# Patient Record
Sex: Male | Born: 1975 | Race: White | Hispanic: No | Smoking: Former smoker
Health system: Southern US, Community
[De-identification: ages and names within clinical notes are randomized; demographics above are authoritative.]

## PROBLEM LIST (undated history)

## (undated) DIAGNOSIS — R45851 Suicidal ideations: Secondary | ICD-10-CM

## (undated) DIAGNOSIS — K219 Gastro-esophageal reflux disease without esophagitis: Secondary | ICD-10-CM

## (undated) DIAGNOSIS — F329 Major depressive disorder, single episode, unspecified: Secondary | ICD-10-CM

## (undated) DIAGNOSIS — F1010 Alcohol abuse, uncomplicated: Secondary | ICD-10-CM

## (undated) DIAGNOSIS — Z8719 Personal history of other diseases of the digestive system: Secondary | ICD-10-CM

## (undated) DIAGNOSIS — F32A Depression, unspecified: Secondary | ICD-10-CM

## (undated) HISTORY — DX: Gastro-esophageal reflux disease without esophagitis: K21.9

---

## 2006-04-04 ENCOUNTER — Emergency Department (HOSPITAL_COMMUNITY): Admission: EM | Admit: 2006-04-04 | Discharge: 2006-04-04 | Payer: Self-pay | Admitting: Emergency Medicine

## 2008-05-18 ENCOUNTER — Encounter: Payer: Self-pay | Admitting: Cardiovascular Disease

## 2008-05-18 HISTORY — PX: TRANSTHORACIC ECHOCARDIOGRAM: SHX275

## 2008-05-31 ENCOUNTER — Ambulatory Visit: Payer: Self-pay | Admitting: Internal Medicine

## 2008-05-31 DIAGNOSIS — R197 Diarrhea, unspecified: Secondary | ICD-10-CM

## 2008-05-31 DIAGNOSIS — K219 Gastro-esophageal reflux disease without esophagitis: Secondary | ICD-10-CM

## 2008-05-31 DIAGNOSIS — R079 Chest pain, unspecified: Secondary | ICD-10-CM

## 2008-09-12 ENCOUNTER — Encounter: Payer: Self-pay | Admitting: Internal Medicine

## 2009-10-02 ENCOUNTER — Encounter: Payer: Self-pay | Admitting: Internal Medicine

## 2009-10-12 ENCOUNTER — Emergency Department (HOSPITAL_COMMUNITY): Admission: EM | Admit: 2009-10-12 | Discharge: 2009-10-12 | Payer: Self-pay | Admitting: Emergency Medicine

## 2009-10-14 ENCOUNTER — Telehealth: Payer: Self-pay | Admitting: Internal Medicine

## 2009-10-21 DIAGNOSIS — Z8719 Personal history of other diseases of the digestive system: Secondary | ICD-10-CM

## 2009-10-21 DIAGNOSIS — E785 Hyperlipidemia, unspecified: Secondary | ICD-10-CM

## 2009-10-22 ENCOUNTER — Ambulatory Visit: Payer: Self-pay | Admitting: Gastroenterology

## 2009-10-22 ENCOUNTER — Encounter: Payer: Self-pay | Admitting: Physician Assistant

## 2009-10-22 DIAGNOSIS — R1013 Epigastric pain: Secondary | ICD-10-CM | POA: Insufficient documentation

## 2009-10-22 DIAGNOSIS — R195 Other fecal abnormalities: Secondary | ICD-10-CM

## 2009-10-26 ENCOUNTER — Emergency Department (HOSPITAL_COMMUNITY): Admission: EM | Admit: 2009-10-26 | Discharge: 2009-10-26 | Payer: Self-pay | Admitting: Emergency Medicine

## 2009-10-29 ENCOUNTER — Ambulatory Visit: Payer: Self-pay | Admitting: Internal Medicine

## 2009-10-31 ENCOUNTER — Emergency Department (HOSPITAL_COMMUNITY): Admission: EM | Admit: 2009-10-31 | Discharge: 2009-11-01 | Payer: Self-pay | Admitting: Emergency Medicine

## 2010-03-11 ENCOUNTER — Emergency Department (HOSPITAL_COMMUNITY): Admission: EM | Admit: 2010-03-11 | Discharge: 2010-03-11 | Payer: Self-pay | Admitting: Emergency Medicine

## 2010-05-27 NOTE — Letter (Signed)
Summary: [facility]  [facility]   Imported By: Sherian Rein 10/21/2009 12:09:07  _____________________________________________________________________  External Attachment:    Type:   Image     Comment:   External Document

## 2010-05-27 NOTE — Letter (Signed)
Summary: [facility]  [facility]   Imported By: Sherian Rein 10/21/2009 12:08:08  _____________________________________________________________________  External Attachment:    Type:   Image     Comment:   External Document

## 2010-05-27 NOTE — Progress Notes (Signed)
Summary: Appt sooner than 11-01-09  Phone Note Call from Patient Call back at Home Phone [phone]   Call For: DR Juanluis Guastella Reason for Call: Talk to Nurse Summary of Call: His stomach was bleeding- went to Emergency Room. Wonders if he can be seen sooner than 11-01-09 Initial call taken by: Leanor Kail [facility],  October 14, 2009 11:34 AM  Follow-up for Phone Call        Given appt. with PA for Tuesday. Follow-up by: Teryl Lucy RN,  October 14, 2009 3:08 PM

## 2010-05-27 NOTE — Letter (Signed)
Summary: EGD Instructions  [facility]  [address] [place], [place] [address]   Phone: [phone]  Fax: [phone]       Scott Chandler    1975-12-11    MRN: [id number]       Procedure Day /Date: 10/29/09 Tuesday     Arrival Time: 2:00 pm     Procedure Time: 3:00 pm     Location of Procedure:                    _ x _ [facility] (4th Floor)  PREPARATION FOR ENDOSCOPY   On 10/29/09 THE DAY OF THE PROCEDURE:  1.   No solid foods, milk or milk products are allowed after midnight the night before your procedure.  2.   Do not drink anything colored red or purple.  Avoid juices with pulp.  No orange juice.  3.  You may drink clear liquids until 1:00 pm, which is 2 hours before your procedure.                                                                                                CLEAR LIQUIDS INCLUDE: Water Jello Ice Popsicles Tea (sugar ok, no milk/cream) Powdered fruit flavored drinks Coffee (sugar ok, no milk/cream) Gatorade Juice: apple, white grape, white cranberry  Lemonade Clear bullion, consomm, broth Carbonated beverages (any kind) Strained chicken noodle soup Hard Candy   MEDICATION INSTRUCTIONS  Unless otherwise instructed, you should take regular prescription medications with a small sip of water as early as possible the morning of your procedure.                  OTHER INSTRUCTIONS  You will need a responsible adult at least 37 years of age to accompany you and drive you home.   This person must remain in the waiting room during your procedure.  Wear loose fitting clothing that is easily removed.  Leave jewelry and other valuables at home.  However, you may wish to bring a book to read or an iPod/MP3 player to listen to music as you wait for your procedure to start.  Remove all body piercing jewelry and leave at home.  Total time from sign-in until discharge is approximately 2-3 hours.  You should go home  directly after your procedure and rest.  You can resume normal activities the day after your procedure.  The day of your procedure you should not:   Drive   Make legal decisions   Operate machinery   Drink alcohol   Return to work  You will receive specific instructions about eating, activities and medications before you leave.    The above instructions have been reviewed and explained to me by   Lamona Curl CMA Duncan Dull)  October 22, 2009 9:05 AM     I fully understand and can verbalize these instructions _____________________________ Date 10/22/09

## 2010-05-27 NOTE — Assessment & Plan Note (Signed)
Summary: EPIGASTRIC PAIN, HX GERD...AS./post er   History of Present Illness Visit Type: consult Primary GI MD: Yancey Flemings MD Primary Provider: Maryelizabeth Rowan, MD Requesting Provider: Maryelizabeth Rowan, MD Chief Complaint: Epigastric pain, vomiting blood History of Present Illness:   34 YO MALE KNOWN TO DR. PERRY FROM EVALUATION IN 2010. HE HAS HX OF GERD AND GASTRITIS. HE HAD REMOTE EGD IN [place] ABOUT 10 YEARS AGO SHOWING GASTRITIS.HE HAS BEEN ON OMEPRAZOLE 20 MG DAILY OVER THE PAST 18 MONTHS. HE HAD A RECENT ER VISIT WITH ACUTE EPIGASTRIC PAIN A DARK STOOL AND HAD VOMITED WITH A METALLIC BLOOD TINGED TASTE . LABS WERE UNREMARKABLE, INCLUDING LIVER TESTS AND LIPASE. HGB 16.6.  HE HAD BEEN TAKING 2 IBUPROFEN DAILY WHICH HE STOPPED. HE ALSO DRINKS REGULARLY THOUGH DENIES DAILY USE TO ME. HE SAYS HE MAY DRINK ALOT ON WEEKENDS THEN MAYBE ONCE DURING THE WEEK-NONE SINCE ER VISIT.DENIES HEARTBURN,DYSPHAGIA. APPETITIE HAD BEEN OFF, WEIGHT DOWN A COUPLE POUNDS, NAUSEA INTERMITTENT. NO FURTHER VOMITING, OR DARK STOOLS. HE HAS HAD INTERMITTENT SHARP CHEST PAINS FOR A COUPLE YEARS AND SOME PAIN UNDER LEFT RIBS. PREVIOUS NEGATIVE CARDIAC W/U.   HE HAS BACK PAIN X 3 DAYS SINCE LIFTED HIS WIFE'S WHEELCHAIR.   GI Review of Systems    Reports abdominal pain, acid reflux, chest pain, loss of appetite, nausea, vomiting, and  vomiting blood.     Location of  Abdominal pain: epigastric area.    Denies belching, bloating, dysphagia with liquids, dysphagia with solids, heartburn, and  weight loss.        Denies anal fissure, black tarry stools, change in bowel habit, constipation, diarrhea, diverticulosis, fecal incontinence, heme positive stool, hemorrhoids, irritable bowel syndrome, jaundice, light color stool, liver problems, rectal bleeding, and  rectal pain.   Current Medications (verified): 1)  Omeprazole 20 Mg Cpdr (Omeprazole) .[name].. 1 Tablet By Mouth Once Daily 2)  Claritin 10 Mg Tabs (Loratadine)  .... Take 1 Tablet By Mouth Once A Day As Needed  Allergies (verified): 1)  * Narcotics  Past History:  Past Medical History: Reviewed history from 05/31/2008 and no changes required. Anxiety Disorder Depression GERD Hyperlipidemia Obesity  Past Surgical History: Reviewed history from 05/31/2008 and no changes required. Unremarkable  Family History: Family History of Breast Cancer:mother and grandmother Family History of Diabetes: father Family History of Heart Disease: father No FH of Colon Cancer: Family History of Prostate Cancer: MGF  Social History: Married, 1 girl Occupation: Consulting civil engineer Patient is a former smoker. -stopped in January 2007 Alcohol Use - yes   1/2-1 gallon of whiskey per week; 6-8 drinks daily of scotch Daily Caffeine Use soft drinks Illicit Drug Use - no  Review of Systems  The patient denies allergy/sinus, anemia, anxiety-new, arthritis/joint pain, back pain, blood in urine, breast changes/lumps, confusion, cough, coughing up blood, depression-new, fainting, fatigue, fever, headaches-new, hearing problems, heart murmur, heart rhythm changes, itching, muscle pains/cramps, night sweats, nosebleeds, shortness of breath, skin rash, sleeping problems, sore throat, swelling of feet/legs, swollen lymph glands, thirst - excessive, urination - excessive, urination changes/pain, urine leakage, vision changes, and voice change.         SEE HPI  Vital Signs:  Patient profile:   35 year old male Height:      71 inches Weight:      254 pounds BMI:     35.55 Pulse rate:   76 / minute Pulse rhythm:   regular BP sitting:   112 / 80  (left arm) Cuff size:  large  Vitals Entered By: Francee Piccolo CMA Duncan Dull) (October 22, 2009 8:38 AM)  Physical Exam  General:  Well developed, well nourished, no acute distress. Head:  Normocephalic and atraumatic. Eyes:  PERRLA, no icterus. Lungs:  Clear throughout to auscultation. Heart:  Regular rate and rhythm; no  murmurs, rubs,  or bruits. Abdomen:  SOFT, TENDER EPIGASTRIUM,ALSO TENDER OVER XYPHOID PROCESS. NO MASS OR HSM,BS+ Rectal:  NOT DONE Extremities:  No clubbing, cyanosis, edema or deformities noted. Neurologic:  Alert and  oriented x4;  grossly normal neurologically. Psych:  Alert and cooperative. Normal mood and affect.  Impression & Recommendations:  Problem # 1:  EPIGASTRIC PAIN (ICD-789.06) Assessment Deteriorated 34 YO MALE WITH HX OF GERD,ON CHRONIC PPI-WITH RECENT ACUTE EPIGASTRIC PAIN,HEME POSITIVE STOOL. SUSPECT NSAID AND ETOH INDUCD GASTROPATHY/R/O PUD. INCREASE OMEPRAZOLE TO 20 MG TWICE DAILY NO NSAIDS DISCUSSED ETOH USE, TOLD THAT HE IS CONSUMING TOO MUCH ,TOLD TO SIGNIFICANTLY DECREASE INTAKE. ULTRAM 50 MG  Q8 HOURS AS NEEDED FOR ACUTE BACK PAIN-LIMITED RX WILL NOT REFILL. EGD WITH DR. PERRY. PROCEDURE DISCUSSED  IN DETAIL WITH PT, RISKS, BENEFITS AND ALTERNATIVES AND HE IS AGREEABLE. Orders: EGD (EGD)  Problem # 2:  CHEST PAIN (ICD-786.50) Assessment: Unchanged CHRONIC-INTERMITTENT MAY HAVE COMPONENT SECONDARY TO GERD,BUT ALSO DEFINTE MUSCULOSKELETAL  COMPONENT  AS WELL.  Patient Instructions: 1)  You have been scheduled for an Endoscopy with Dr Marina Goodell on 10/29/09. 2)  Please increase your omeprazole to two times a day dosing. 3)  Please avoid all Aspirin and anti-inflamatory medications. 4)  We have sent you a prescription for ultram for you to take every 8 hours on an as needed basis. 5)  Copy sent to : Dr Maryelizabeth Rowan, Dr Yancey Flemings 6)  The medication list was reviewed and reconciled.  All changed / newly prescribed medications were explained.  A complete medication list was provided to the patient / caregiver.  Prescriptions: ULTRAM 50 MG TABS (TRAMADOL HCL) Take 1 tablet by mouth every 8 hours as needed  #30 x 0   Entered by:   Lamona Curl CMA (AAMA)   Authorized by:   Sammuel Cooper PA-c   Signed by:   Lamona Curl CMA (AAMA) on 10/22/2009    Method used:   Electronically to        [facility] [phone]* (retail)       [address]       [place], [place]  [address]       Ph: [phone]       Fax: [phone]   RxID:   [id number]

## 2010-05-27 NOTE — Procedures (Signed)
Summary: Upper Endoscopy  Patient: Scott Chandler Note: All result statuses are Final unless otherwise noted.  Tests: (1) Upper Endoscopy (EGD)   EGD Upper Endoscopy       DONE     [facility]     [address]     [place], [place]  [address]           ENDOSCOPY PROCEDURE REPORT           PATIENT:  [name], [name]  MR#:  [id number]     BIRTHDATE:  [birth year], 34 yrs. old  GENDER:  male           ENDOSCOPIST:  Wilhemina Bonito. Eda Keys, MD     Referred by:  Office           PROCEDURE DATE:  10/29/2009     PROCEDURE:  EGD, diagnostic     ASA CLASS:  Class II     INDICATIONS:  hematemesis, epigastric pain, chest pain           MEDICATIONS:   Benadryl 50 mg IV, Versed 10 mg IV     TOPICAL ANESTHETIC:  Exactacain Spray           DESCRIPTION OF PROCEDURE:   After the risks benefits and     alternatives of the procedure were thoroughly explained, informed     consent was obtained.  The LB [id number]-H180 [id number] endoscope was     introduced through the mouth and advanced to the second portion of     the duodenum, without limitations.  The instrument was slowly     withdrawn as the mucosa was fully examined.     <<PROCEDUREIMAGES>>           The esophagus and gastroesophageal junction were completely normal     in appearance.  The stomach was entered and closely examined. The     antrum, angularis, and lesser curvature were well visualized,     including a retroflexed view of the cardia and fundus. The stomach     wall was normally distensable. The scope passed easily through the     pylorus into the duodenum.  The duodenal bulb was normal in     appearance, as was the postbulbar duodenum.    Retroflexed views     revealed a hiatal hernia.    The scope was then withdrawn from the     patient and the procedure completed.           COMPLICATIONS:  None           ENDOSCOPIC IMPRESSION:     1) Normal esophagus     2) Normal stomach     3) Normal duodenum     4) A hiatal hernia     5)  Gerd     RECOMMENDATIONS:     1) Anti-reflux regimen to be followed     2) continue Omeprazole daily     3) MINIMIZE NSAIDS AND ALCOHOL USE           ______________________________     Wilhemina Bonito. Eda Keys, MD           CC:  Maryelizabeth Rowan, MD, The Patient           n.     eSIGNED:   Wilhemina Bonito. Eda Keys at 10/29/2009 02:55 PM           Hiram Comber, [id number]  Note: An exclamation mark (!) indicates a result that  was not dispersed into the flowsheet. Document Creation Date: 10/29/2009 2:56 PM _______________________________________________________________________  (1) Order result status: Final Collection or observation date-time: 10/29/2009 14:49 Requested date-time:  Receipt date-time:  Reported date-time:  Referring Physician:   Ordering Physician: Fransico Setters [id number]) Specimen Source:  Source: Launa Grill Order Number: [id number] Lab site:

## 2010-07-08 LAB — DIFFERENTIAL
Basophils Relative: 1 % (ref 0–1)
Lymphocytes Relative: 33 % (ref 12–46)
Monocytes Absolute: 0.9 10*3/uL (ref 0.1–1.0)
Monocytes Relative: 11 % (ref 3–12)
Neutro Abs: 4.6 10*3/uL (ref 1.7–7.7)

## 2010-07-08 LAB — COMPREHENSIVE METABOLIC PANEL
AST: 24 U/L (ref 0–37)
BUN: 11 mg/dL (ref 6–23)
CO2: 28 mEq/L (ref 19–32)
Calcium: 9.4 mg/dL (ref 8.4–10.5)
Chloride: 103 mEq/L (ref 96–112)
Creatinine, Ser: 1.06 mg/dL (ref 0.4–1.5)
GFR calc Af Amer: >60 mL/min (ref >60)

## 2010-07-08 LAB — CBC
HCT: 43 % (ref 39.0–52.0)
MCH: 30.7 pg (ref 26.0–34.0)
MCV: 87.4 fL (ref 78.0–100.0)
RDW: 12.7 % (ref 11.5–15.5)
WBC: 8.5 10*3/uL (ref 4.0–10.5)

## 2010-07-08 LAB — POCT CARDIAC MARKERS: Troponin i, poc: <0.05 ng/mL (ref 0.00–0.09)

## 2010-07-13 LAB — COMPREHENSIVE METABOLIC PANEL
ALT: 53 U/L (ref 0–53)
BUN: 18 mg/dL (ref 6–23)
Creatinine, Ser: 0.97 mg/dL (ref 0.4–1.5)
Total Bilirubin: 0.5 mg/dL (ref 0.3–1.2)
Total Protein: 7.7 g/dL (ref 6.0–8.3)

## 2010-07-13 LAB — CBC
HCT: 47.4 % (ref 39.0–52.0)
Hemoglobin: 16.6 g/dL (ref 13.0–17.0)
MCHC: 34.9 g/dL (ref 30.0–36.0)
MCV: 88.9 fL (ref 78.0–100.0)
RDW: 13 % (ref 11.5–15.5)

## 2010-07-13 LAB — URINALYSIS, ROUTINE W REFLEX MICROSCOPIC
Bilirubin Urine: NEGATIVE
Glucose, UA: NEGATIVE mg/dL
Ketones, ur: 15 mg/dL — AB
Specific Gravity, Urine: 1.013 (ref 1.005–1.030)
pH: 5.5 (ref 5.0–8.0)

## 2010-10-26 ENCOUNTER — Emergency Department (HOSPITAL_COMMUNITY)
Admission: EM | Admit: 2010-10-26 | Discharge: 2010-10-26 | Disposition: A | Discharge status: Home / Self Care | Payer: PRIVATE HEALTH INSURANCE | Attending: Emergency Medicine | Admitting: Emergency Medicine

## 2010-10-26 DIAGNOSIS — S335XXA Sprain of ligaments of lumbar spine, initial encounter: Secondary | ICD-10-CM | POA: Insufficient documentation

## 2010-10-26 DIAGNOSIS — M545 Low back pain, unspecified: Secondary | ICD-10-CM | POA: Insufficient documentation

## 2010-10-26 DIAGNOSIS — X58XXXA Exposure to other specified factors, initial encounter: Secondary | ICD-10-CM | POA: Insufficient documentation

## 2010-10-26 DIAGNOSIS — K219 Gastro-esophageal reflux disease without esophagitis: Secondary | ICD-10-CM | POA: Insufficient documentation

## 2011-08-19 ENCOUNTER — Encounter: Payer: Self-pay | Admitting: *Deleted

## 2011-08-29 IMAGING — CR DG NECK SOFT TISSUE
2 series · 2 of 2 positions shown · non-contrast
Comparison: None.

CLINICAL DATA: Inhaled piece of meat; stuck in throat.

NECK SOFT TISSUES - 1+ VIEW

[w soft tissue neck * (1 of 2)]
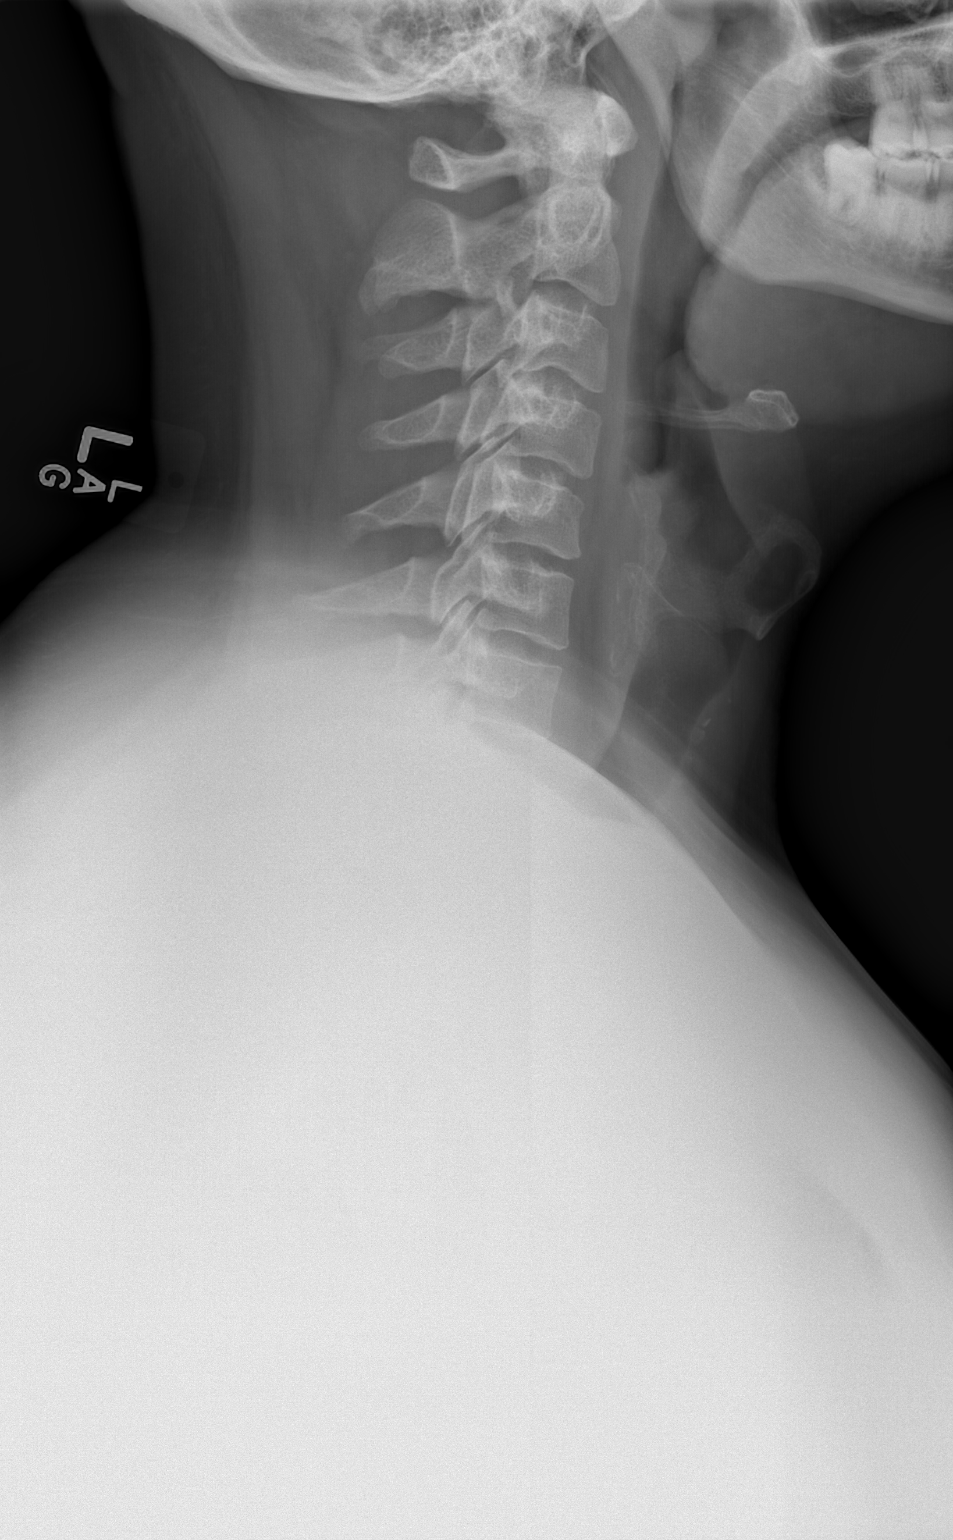

[w soft tissue neck * (2 of 2)]
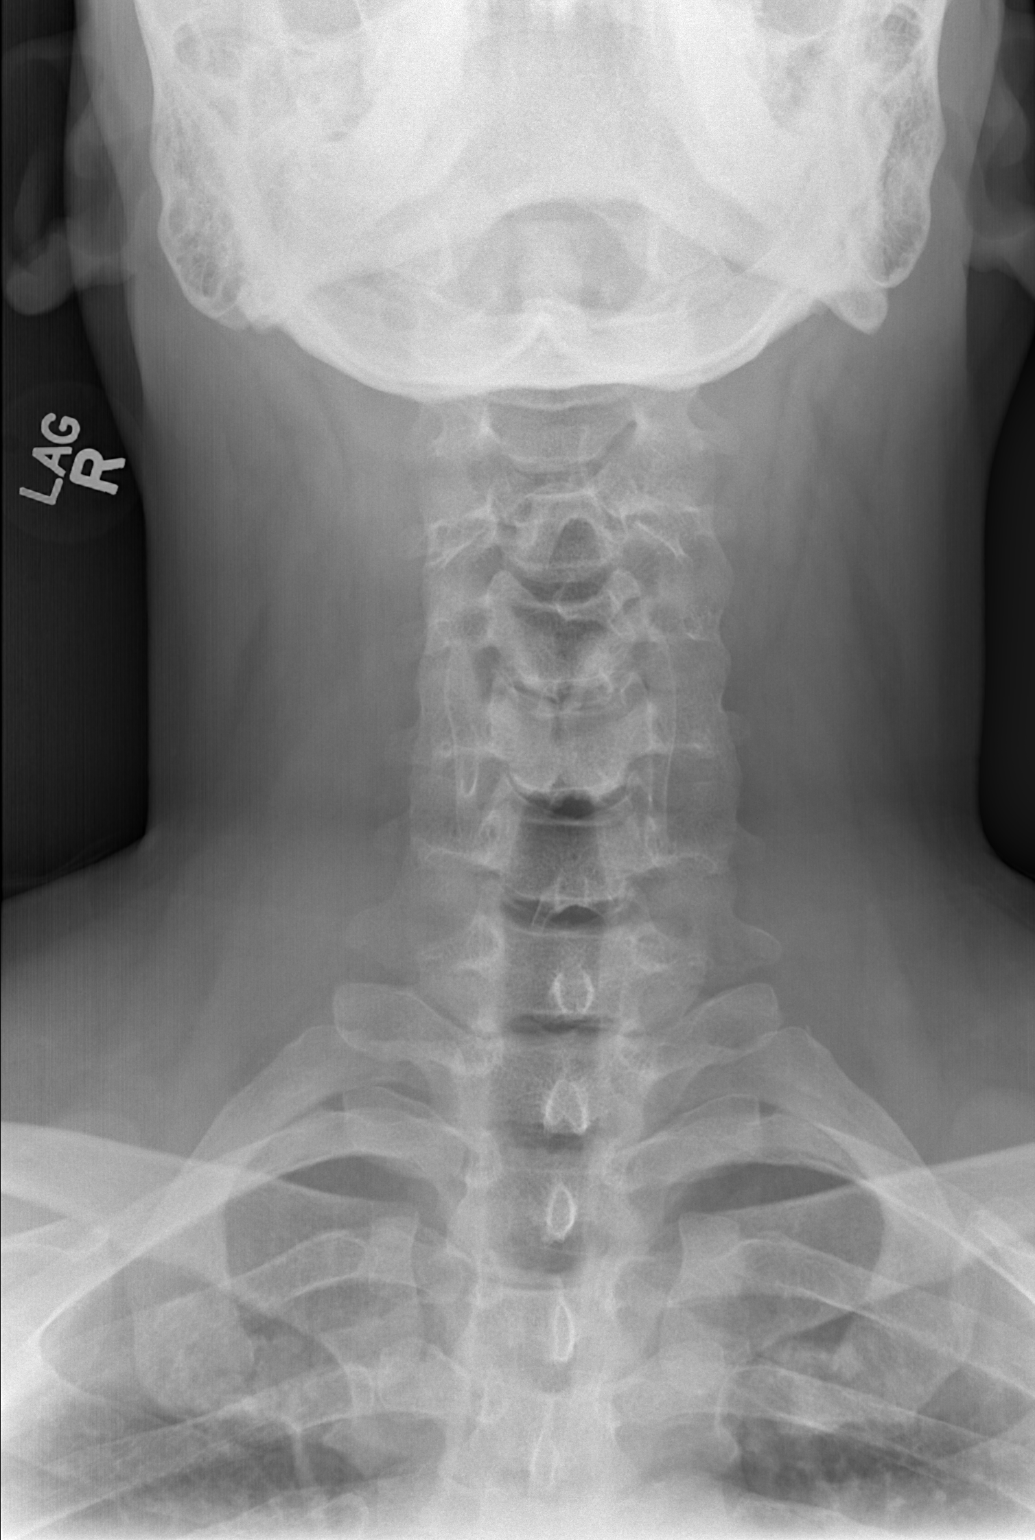

[2 of 2 positions shown; findings below may reference images not displayed]

FINDINGS: No definite foreign body is identified within the
hypopharynx or proximal trachea.  However, a small piece of meat
cannot be excluded, as this would not tend to be significantly
radiopaque.  The proximal trachea is grossly unremarkable in
appearance.

The proximal esophagus is decompressed and appears unremarkable.
The epiglottis is within normal limits.  Prevertebral soft tissues
are within normal limits.  No acute osseous abnormalities are seen.
The visualized lung apices are clear.
IMPRESSION: No definite radiopaque foreign body identified.  Hypopharynx,
proximal trachea and proximal esophagus unremarkable in appearance.

## 2015-03-05 ENCOUNTER — Encounter: Payer: Self-pay | Admitting: Internal Medicine

## 2015-11-14 ENCOUNTER — Emergency Department (HOSPITAL_COMMUNITY)
Admission: EM | Admit: 2015-11-14 | Discharge: 2015-11-14 | Disposition: A | Discharge status: Other Acute Inpatient Hospital | Payer: BLUE CROSS/BLUE SHIELD | Attending: Emergency Medicine | Admitting: Emergency Medicine

## 2015-11-14 ENCOUNTER — Encounter (HOSPITAL_COMMUNITY): Payer: Self-pay | Admitting: *Deleted

## 2015-11-14 ENCOUNTER — Encounter (HOSPITAL_COMMUNITY): Payer: Self-pay

## 2015-11-14 ENCOUNTER — Inpatient Hospital Stay (HOSPITAL_COMMUNITY)
Admission: AD | Admit: 2015-11-14 | Discharge: 2015-11-18 | DRG: 897 | Disposition: A | Discharge status: Home / Self Care | Payer: BLUE CROSS/BLUE SHIELD | Source: Intra-hospital | Attending: Psychiatry | Admitting: Psychiatry

## 2015-11-14 ENCOUNTER — Ambulatory Visit (HOSPITAL_COMMUNITY)
Admission: RE | Admit: 2015-11-14 | Discharge: 2015-11-14 | Disposition: A | Discharge status: Home / Self Care | Payer: BLUE CROSS/BLUE SHIELD | Source: Home / Self Care | Attending: Psychiatry | Admitting: Psychiatry

## 2015-11-14 DIAGNOSIS — Z87891 Personal history of nicotine dependence: Secondary | ICD-10-CM | POA: Diagnosis not present

## 2015-11-14 DIAGNOSIS — F10239 Alcohol dependence with withdrawal, unspecified: Secondary | ICD-10-CM | POA: Diagnosis present

## 2015-11-14 DIAGNOSIS — Z791 Long term (current) use of non-steroidal anti-inflammatories (NSAID): Secondary | ICD-10-CM | POA: Insufficient documentation

## 2015-11-14 DIAGNOSIS — G4700 Insomnia, unspecified: Secondary | ICD-10-CM | POA: Diagnosis present

## 2015-11-14 DIAGNOSIS — K219 Gastro-esophageal reflux disease without esophagitis: Secondary | ICD-10-CM | POA: Diagnosis present

## 2015-11-14 DIAGNOSIS — E785 Hyperlipidemia, unspecified: Secondary | ICD-10-CM | POA: Diagnosis present

## 2015-11-14 DIAGNOSIS — Z79899 Other long term (current) drug therapy: Secondary | ICD-10-CM | POA: Insufficient documentation

## 2015-11-14 DIAGNOSIS — K2990 Gastroduodenitis, unspecified, without bleeding: Secondary | ICD-10-CM | POA: Insufficient documentation

## 2015-11-14 DIAGNOSIS — Z818 Family history of other mental and behavioral disorders: Secondary | ICD-10-CM

## 2015-11-14 DIAGNOSIS — F10229 Alcohol dependence with intoxication, unspecified: Secondary | ICD-10-CM | POA: Diagnosis not present

## 2015-11-14 DIAGNOSIS — F1020 Alcohol dependence, uncomplicated: Secondary | ICD-10-CM | POA: Diagnosis present

## 2015-11-14 DIAGNOSIS — Z8249 Family history of ischemic heart disease and other diseases of the circulatory system: Secondary | ICD-10-CM

## 2015-11-14 DIAGNOSIS — F1094 Alcohol use, unspecified with alcohol-induced mood disorder: Secondary | ICD-10-CM | POA: Diagnosis not present

## 2015-11-14 DIAGNOSIS — F3289 Other specified depressive episodes: Secondary | ICD-10-CM | POA: Diagnosis not present

## 2015-11-14 DIAGNOSIS — F1024 Alcohol dependence with alcohol-induced mood disorder: Principal | ICD-10-CM | POA: Diagnosis present

## 2015-11-14 DIAGNOSIS — R45851 Suicidal ideations: Secondary | ICD-10-CM | POA: Diagnosis present

## 2015-11-14 DIAGNOSIS — F1014 Alcohol abuse with alcohol-induced mood disorder: Secondary | ICD-10-CM | POA: Diagnosis present

## 2015-11-14 DIAGNOSIS — F419 Anxiety disorder, unspecified: Secondary | ICD-10-CM | POA: Diagnosis present

## 2015-11-14 DIAGNOSIS — F329 Major depressive disorder, single episode, unspecified: Secondary | ICD-10-CM | POA: Diagnosis present

## 2015-11-14 DIAGNOSIS — K2920 Alcoholic gastritis without bleeding: Secondary | ICD-10-CM

## 2015-11-14 LAB — CBC WITH DIFFERENTIAL/PLATELET
BASOS ABS: 0.1 10*3/uL (ref 0.0–0.1)
BASOS PCT: 1 %
EOS PCT: 1 %
Eosinophils Absolute: 0.1 10*3/uL (ref 0.0–0.7)
HCT: 50.4 % (ref 39.0–52.0)
Hemoglobin: 17.7 g/dL — ABNORMAL HIGH (ref 13.0–17.0)
LYMPHS PCT: 28 %
Lymphs Abs: 2.5 10*3/uL (ref 0.7–4.0)
MCH: 30.9 pg (ref 26.0–34.0)
MCHC: 35.1 g/dL (ref 30.0–36.0)
MCV: 88.1 fL (ref 78.0–100.0)
MONO ABS: 1.1 10*3/uL — AB (ref 0.1–1.0)
Monocytes Relative: 13 %
Neutro Abs: 5.2 10*3/uL (ref 1.7–7.7)
Neutrophils Relative %: 57 %
PLATELETS: 251 10*3/uL (ref 150–400)
RBC: 5.72 MIL/uL (ref 4.22–5.81)
RDW: 13 % (ref 11.5–15.5)
WBC: 9 10*3/uL (ref 4.0–10.5)

## 2015-11-14 LAB — COMPREHENSIVE METABOLIC PANEL
ALK PHOS: 66 U/L (ref 38–126)
ALT: 38 U/L (ref 17–63)
ANION GAP: 12 (ref 5–15)
AST: 36 U/L (ref 15–41)
Albumin: 4.7 g/dL (ref 3.5–5.0)
BUN: 9 mg/dL (ref 6–20)
CALCIUM: 9.7 mg/dL (ref 8.9–10.3)
CHLORIDE: 99 mmol/L — AB (ref 101–111)
CO2: 26 mmol/L (ref 22–32)
Creatinine, Ser: 0.81 mg/dL (ref 0.61–1.24)
Glucose, Bld: 83 mg/dL (ref 65–99)
Potassium: 4.2 mmol/L (ref 3.5–5.1)
SODIUM: 137 mmol/L (ref 135–145)
Total Bilirubin: 0.7 mg/dL (ref 0.3–1.2)
Total Protein: 8.2 g/dL — ABNORMAL HIGH (ref 6.5–8.1)

## 2015-11-14 LAB — ETHANOL: ALCOHOL ETHYL (B): 21 mg/dL — AB (ref <5)

## 2015-11-14 LAB — SALICYLATE LEVEL

## 2015-11-14 LAB — RAPID URINE DRUG SCREEN, HOSP PERFORMED
AMPHETAMINES: NOT DETECTED
BENZODIAZEPINES: NOT DETECTED
Barbiturates: NOT DETECTED
Cocaine: NOT DETECTED
Opiates: NOT DETECTED
TETRAHYDROCANNABINOL: NOT DETECTED

## 2015-11-14 LAB — LIPASE, BLOOD: LIPASE: 45 U/L (ref 11–51)

## 2015-11-14 LAB — ACETAMINOPHEN LEVEL

## 2015-11-14 MED ORDER — HYDROXYZINE HCL 25 MG PO TABS
25.0000 mg | ORAL_TABLET | Freq: Four times a day (QID) | ORAL | Status: AC | PRN
  Administered 2015-11-15 – 2015-11-16 (×2): 25 mg via ORAL
  Filled 2015-11-14 (×2): qty 1

## 2015-11-14 MED ORDER — ONDANSETRON 4 MG PO TBDP: 4.0000 mg | ORAL_TABLET | Freq: Four times a day (QID) | ORAL | Status: DC | PRN

## 2015-11-14 MED ORDER — SERTRALINE HCL 50 MG PO TABS: 100.0000 mg | ORAL_TABLET | Freq: Every day | ORAL | Status: DC

## 2015-11-14 MED ORDER — LORAZEPAM 1 MG PO TABS: 1.0000 mg | ORAL_TABLET | Freq: Three times a day (TID) | ORAL | Status: DC

## 2015-11-14 MED ORDER — LORAZEPAM 1 MG PO TABS: 1.0000 mg | ORAL_TABLET | Freq: Every day | ORAL | Status: DC

## 2015-11-14 MED ORDER — PROPRANOLOL HCL 20 MG PO TABS
20.0000 mg | ORAL_TABLET | Freq: Two times a day (BID) | ORAL | Status: DC | PRN
  Filled 2015-11-14: qty 1

## 2015-11-14 MED ORDER — HYDROXYZINE HCL 25 MG PO TABS: 25.0000 mg | ORAL_TABLET | Freq: Four times a day (QID) | ORAL | Status: DC | PRN

## 2015-11-14 MED ORDER — LORAZEPAM 1 MG PO TABS
1.0000 mg | ORAL_TABLET | Freq: Every day | ORAL | Status: AC
  Administered 2015-11-17: 1 mg via ORAL
  Filled 2015-11-14: qty 1

## 2015-11-14 MED ORDER — MAGNESIUM HYDROXIDE 400 MG/5ML PO SUSP: 30.0000 mL | Freq: Every day | ORAL | Status: DC | PRN

## 2015-11-14 MED ORDER — PROPRANOLOL HCL 10 MG PO TABS
20.0000 mg | ORAL_TABLET | Freq: Two times a day (BID) | ORAL | Status: DC | PRN
  Administered 2015-11-17 – 2015-11-18 (×3): 20 mg via ORAL
  Filled 2015-11-14 (×3): qty 2

## 2015-11-14 MED ORDER — LORAZEPAM 1 MG PO TABS: 1.0000 mg | ORAL_TABLET | Freq: Four times a day (QID) | ORAL | Status: DC

## 2015-11-14 MED ORDER — ONDANSETRON 4 MG PO TBDP: 4.0000 mg | ORAL_TABLET | Freq: Four times a day (QID) | ORAL | Status: AC | PRN

## 2015-11-14 MED ORDER — LORAZEPAM 1 MG PO TABS
1.0000 mg | ORAL_TABLET | Freq: Three times a day (TID) | ORAL | Status: AC
  Administered 2015-11-15 (×3): 1 mg via ORAL
  Filled 2015-11-14 (×3): qty 1

## 2015-11-14 MED ORDER — ACETAMINOPHEN 325 MG PO TABS
650.0000 mg | ORAL_TABLET | Freq: Four times a day (QID) | ORAL | Status: DC | PRN
  Administered 2015-11-15 – 2015-11-18 (×5): 650 mg via ORAL
  Filled 2015-11-14 (×5): qty 2

## 2015-11-14 MED ORDER — LOPERAMIDE HCL 2 MG PO CAPS
2.0000 mg | ORAL_CAPSULE | ORAL | Status: AC | PRN
  Administered 2015-11-15: 4 mg via ORAL
  Filled 2015-11-14: qty 2

## 2015-11-14 MED ORDER — ALUM & MAG HYDROXIDE-SIMETH 200-200-20 MG/5ML PO SUSP: 30.0000 mL | ORAL | Status: DC | PRN

## 2015-11-14 MED ORDER — VITAMIN B-1 100 MG PO TABS
100.0000 mg | ORAL_TABLET | Freq: Every day | ORAL | Status: DC
  Administered 2015-11-15 – 2015-11-18 (×4): 100 mg via ORAL
  Filled 2015-11-14 (×6): qty 1

## 2015-11-14 MED ORDER — LORAZEPAM 1 MG PO TABS: 1.0000 mg | ORAL_TABLET | Freq: Two times a day (BID) | ORAL | Status: DC

## 2015-11-14 MED ORDER — THIAMINE HCL 100 MG/ML IJ SOLN: 100.0000 mg | Freq: Once | INTRAMUSCULAR | Status: DC

## 2015-11-14 MED ORDER — LORAZEPAM 1 MG PO TABS
1.0000 mg | ORAL_TABLET | Freq: Four times a day (QID) | ORAL | Status: AC
  Administered 2015-11-14: 1 mg via ORAL
  Filled 2015-11-14: qty 1

## 2015-11-14 MED ORDER — VITAMIN B-1 100 MG PO TABS: 100.0000 mg | ORAL_TABLET | Freq: Every day | ORAL | Status: DC

## 2015-11-14 MED ORDER — LORAZEPAM 1 MG PO TABS: 1.0000 mg | ORAL_TABLET | Freq: Four times a day (QID) | ORAL | Status: DC | PRN

## 2015-11-14 MED ORDER — LORAZEPAM 1 MG PO TABS
1.0000 mg | ORAL_TABLET | Freq: Two times a day (BID) | ORAL | Status: AC
  Administered 2015-11-16 (×2): 1 mg via ORAL
  Filled 2015-11-14 (×2): qty 1

## 2015-11-14 MED ORDER — LOPERAMIDE HCL 2 MG PO CAPS: 2.0000 mg | ORAL_CAPSULE | ORAL | Status: DC | PRN

## 2015-11-14 MED ORDER — SERTRALINE HCL 100 MG PO TABS
100.0000 mg | ORAL_TABLET | Freq: Every day | ORAL | Status: DC
  Administered 2015-11-15: 100 mg via ORAL
  Filled 2015-11-14 (×2): qty 1

## 2015-11-14 MED ORDER — ADULT MULTIVITAMIN W/MINERALS CH
1.0000 | ORAL_TABLET | Freq: Every day | ORAL | Status: DC
  Administered 2015-11-14 – 2015-11-18 (×5): 1 via ORAL
  Filled 2015-11-14 (×7): qty 1

## 2015-11-14 MED ORDER — LORAZEPAM 1 MG PO TABS
1.0000 mg | ORAL_TABLET | Freq: Four times a day (QID) | ORAL | Status: AC | PRN
  Administered 2015-11-15 – 2015-11-17 (×3): 1 mg via ORAL
  Filled 2015-11-14 (×3): qty 1

## 2015-11-14 MED ORDER — ADULT MULTIVITAMIN W/MINERALS CH: 1.0000 | ORAL_TABLET | Freq: Every day | ORAL | Status: DC

## 2015-11-14 NOTE — ED Notes (Signed)
[facility] once medically cleared.

## 2015-11-14 NOTE — Progress Notes (Signed)
Admission Note:  40 year old male who presents voluntary in no acute distress for the treatment of SI, Depression, and Substance Abuse.  Patient reports "I have been drinking for days and days and days".  Patient reports SI with a plan to "hang myself with a belt in the closet".  Patient states "I felt like I was about to snap mentally, got freaked out, and felt like I was going to lose my mind".  Patient appears flat and depressed. Patient was calm and cooperative with admission process. Patient presents with passive SI and contracts for safety upon admission. Patient denies AH. Patient reports "I vividly think I'm awake doing things and then realize that I am laying down with my eyes closed".  Patient reports stressors as recent separation from his wife. Patient reports that he has been separated from his wife for a month.  Patient reports that he called his wife when he began having suicidal thoughts.  Patient reports additional stressor of telling his wife that he has a girlfriend.  Patient's wife continues to be supportive of patient.  Patient also reports that his 1st wife and mother of his daughter died "2 summers ago" and he gets "down" during the summer.  Patient reports that he has had diarrhea for "months".  Skin was assessed and found to be clear of any abnormal marks.  Patient searched and no contraband found, POC and unit policies explained and understanding verbalized. Consents obtained. Food and fluids offered and accepted. Patient had no additional questions or concerns.

## 2015-11-14 NOTE — Progress Notes (Signed)
Pt. Seen for assessment, charge nurse [facility] notified of transport for medical clearance, pending Psych consult . Mariaclara Spear K. Sherlon HandingHarris, LCAS-A, LPC-A, [facility]  Counselor 11/14/2015 9:59 AM

## 2015-11-14 NOTE — ED Provider Notes (Signed)
CSN: [id number]     Arrival date & time 11/14/15  1040 History   First MD Initiated Contact with Patient 11/14/15 1203     Chief Complaint  Patient presents with  . Depression  . Detox   Pt is a 40 yo wm with a hx of alcohol abuse.  [facility].  He c/o depression and anxiety.  He denies si.  The pt has a bed once medically cleared.  He does have some mild epigastric pain.   (Consider location/radiation/quality/duration/timing/severity/associated sxs/prior Treatment) Patient is a 40 y.o. male presenting with depression. The history is provided by the patient. The history is limited by a language barrier.  Depression This is a recurrent problem. The current episode started more than 1 week ago. The problem occurs constantly. The symptoms are relieved by drinking.    Past Medical History  Diagnosis Date  . GERD (gastroesophageal reflux disease)   . Chest pain    Past Surgical History  Procedure Laterality Date  . Transthoracic echocardiogram  05/18/2008    normal LVF/ mild LVH/trace mitral and tricuspid regurgitation   Family History  Problem Relation Age of Onset  . Hypertension Father   . Cancer Mother    Social History  Substance Use Topics  . Smoking status: Former Games developermoker  . Smokeless tobacco: None  . Alcohol Use: Yes    Review of Systems  Psychiatric/Behavioral: Positive for depression and dysphoric mood. The patient is nervous/anxious.   All other systems reviewed and are negative.     Allergies  Review of patient's allergies indicates no known allergies.  Home Medications   Prior to Admission medications   Medication Sig Start Date End Date Taking? Authorizing Provider  ibuprofen (ADVIL,MOTRIN) 200 MG tablet Take 400-600 mg by mouth every 6 (six) hours as needed for fever, headache, mild pain, moderate pain or cramping.   Yes Historical Provider, MD  naltrexone (DEPADE) 50 MG tablet Take 50 mg by mouth daily.   Yes  Historical Provider, MD  Omega-3 Fatty Acids (FISH OIL) 1000 MG CAPS Take 2 capsules by mouth daily.   Yes Historical Provider, MD  propranolol (INDERAL) 20 MG tablet Take 20 mg by mouth 2 (two) times daily as needed (for anxiety).   Yes Historical Provider, MD  sertraline (ZOLOFT) 100 MG tablet Take 100 mg by mouth daily with breakfast.   Yes Historical Provider, MD   BP 136/97 mmHg  Pulse 87  Temp(Src) 97.8 F (36.6 C) (Oral)  Resp 18  SpO2 95% Physical Exam  Constitutional: He is oriented to person, place, and time. He appears well-developed and well-nourished.  HENT:  Head: Normocephalic and atraumatic.  Right Ear: External ear normal.  Left Ear: External ear normal.  Nose: Nose normal.  Mouth/Throat: Oropharynx is clear and moist.  Eyes: Conjunctivae and EOM are normal. Pupils are equal, round, and reactive to light.  Neck: Normal range of motion. Neck supple.  Cardiovascular: Normal rate, regular rhythm, normal heart sounds and intact distal pulses.   Pulmonary/Chest: Effort normal and breath sounds normal.  Abdominal: Soft. Bowel sounds are normal. There is tenderness in the epigastric area.  Musculoskeletal: Normal range of motion.  Neurological: He is alert and oriented to person, place, and time.  Skin: Skin is warm and dry.  Psychiatric: He has a normal mood and affect. His behavior is normal. Judgment and thought content normal.  Nursing note and vitals reviewed.   ED Course  Procedures (including  critical care time) Labs Review Labs Reviewed  ACETAMINOPHEN LEVEL - Abnormal; Notable for the following:    Acetaminophen (Tylenol), Serum <10 (*)    All other components within normal limits  COMPREHENSIVE METABOLIC PANEL - Abnormal; Notable for the following:    Chloride 99 (*)    Total Protein 8.2 (*)    All other components within normal limits  ETHANOL - Abnormal; Notable for the following:    Alcohol, Ethyl (B) 21 (*)    All other components within normal  limits  CBC WITH DIFFERENTIAL/PLATELET - Abnormal; Notable for the following:    Hemoglobin 17.7 (*)    Monocytes Absolute 1.1 (*)    All other components within normal limits  URINE RAPID DRUG SCREEN, HOSP PERFORMED  SALICYLATE LEVEL  LIPASE, BLOOD    Imaging Review No results found. I have personally reviewed and evaluated these images and lab results as part of my medical decision-making.   EKG Interpretation None      MDM  Labs are nl.  Pt ok for transfer to [facility].  Pt has a bed at [facility], so he will be transferred there.  Final diagnoses:  Alcohol dependence with withdrawal with complication (HCC)  Alcohol-induced mood disorder (HCC)      Jacalyn Lefevre, MD 11/14/15 1539

## 2015-11-14 NOTE — BH Assessment (Signed)
Tele Assessment Note   Scott ComberJason Chandler is an 40 y.o. male, Caucasian, Married who [facility] depression and SI. Patient states that his primary concern is of increasing depression with ETOH and binge drinking with current complaint of severe abdominal cramping and hx. Of lower back problems. Patient denies history of psychotic symptoms. Patient wife Scott Chandler present during assessment. Patient states that he last drank alcohol this morning with unknown amount. Per wife, patient has SI with psychological problems. Patient states that he has had recent decline in sleep with 2 hours per night in past month. Historically, pt. States that he has a daughter who is 6816 and she was sent to a group home, along with separation from wife in past month. Patient states that he has started isolating self and drinking heavily within past month. Patient currently stays by self.  Patient denies current [during assessment] SI, but acknowledges SI with plan to hang self in closet with belt. Patient denies history of SI or HI. Patient deneis history or current AVH, but acknowledges VH after drinking binge. Patient denies history of inpatient or outpatient psychiatric care, but states that he does [facility] and was prescribed Zoloft. Patient deneis any residential or outpatient S.A. Treatment. Patient acknowledges history of S.A. With alcohol starting at or around age 89 consuming unknown amounts with known average of 1/5 liquor/ vodka per day for years. Longest period of sobriety was x 1 day months ago from present date. Patient last drank on 11/14/15 this a.m. With unknown amounts.   Patient is dressed in normal street attire crying during assessment, and is alert and oriented x4. Patient speech was within normal limits and motor behavior appeared normal. Patient thought process is coherent but cloudy at times. Patient  does not appear to be responding to internal  stimuli. Patient was cooperative throughout the assessment and states that he  is agreeable to inpatient psychiatric treatment.   Diagnosis: Unspecified Depressive Disorder, Alcohol Use Disorder, Severe  Past Medical History:  Past Medical History  Diagnosis Date  . GERD (gastroesophageal reflux disease)   . Chest pain     Past Surgical History  Procedure Laterality Date  . Transthoracic echocardiogram  05/18/2008    normal LVF/ mild LVH/trace mitral and tricuspid regurgitation    Family History:  Family History  Problem Relation Age of Onset  . Hypertension Father   . Cancer Mother     Social History:  reports that he has quit smoking. He does not have any smokeless tobacco history on file. He reports that he drinks alcohol. His drug history is not on file.  Additional Social History:  Alcohol / Drug Use Pain Medications: SEE MAR Prescriptions: SEE MAR Over the Counter: SEE MAR History of alcohol / drug use?: Yes Longest period of sobriety (when/how long): x 1 day in the past month Negative Consequences of Use: Financial, Legal, Personal relationships Withdrawal Symptoms: Cramps, Patient aware of relationship between substance abuse and physical/medical complications Substance #1 Name of Substance 1: alcohol 1 - Age of First Use: 32 1 - Amount (size/oz): various with average of 1/5 liquor or more 1 - Frequency: daily 1 - Duration: years with binge x 1 month 1 - Last Use / Amount: 11/14/15 unknown amount  CIWA:   COWS:    PATIENT STRENGTHS: (choose at least two) Average or above average intelligence Capable of independent living Communication skills  Allergies: No Known Allergies  Home Medications:  (Not in a hospital admission)  OB/GYN Status:  No LMP for male patient.  General Assessment Data Location of Assessment: [facility] Assessment Services TTS Assessment: In system Is this a Tele or Face-to-Face Assessment?: Face-to-Face Is this an Initial Assessment or a  Re-assessment for this encounter?: Initial Assessment Marital status: Married [place] name: n/a Is patient pregnant?: No Pregnancy Status: No Living Arrangements: Alone Can pt return to current living arrangement?: Yes Admission Status: Voluntary Is patient capable of signing voluntary admission?: Yes Referral Source: Self/Family/Friend Insurance type: [employer]  Medical Screening Exam [facility] Walk-in ONLY) Medical Exam completed: No Reason for MSE not completed: Other: (pt to be transported upon consult for med. clearance)  Crisis Care Plan Living Arrangements: Alone Name of Psychiatrist: none Name of Therapist: none  Education Status Is patient currently in school?: No Current Grade: n/a Highest grade of school patient has completed: B.S. college Name of school: unspecified Contact person: [name] [phone]  Risk to self with the past 6 months Suicidal Ideation: Yes-Currently Present (denies current, but acknowledges present prior to arrival) Has patient been a risk to self within the past 6 months prior to admission? : Yes Suicidal Intent: Yes-Currently Present Has patient had any suicidal intent within the past 6 months prior to admission? : No Is patient at risk for suicide?: Yes Suicidal Plan?: Yes-Currently Present Has patient had any suicidal plan within the past 6 months prior to admission? : No Specify Current Suicidal Plan: hang self with belt in closet Access to Means: Yes Specify Access to Suicidal Means: access to belts What has been your use of drugs/alcohol within the last 12 months?: ETOH, Severe Previous Attempts/Gestures: No How many times?: 0 Other Self Harm Risks: none noted Triggers for Past Attempts: None known Intentional Self Injurious Behavior: None Family Suicide History: No Recent stressful life event(s): Trauma (Comment), Turmoil (Comment) (daughter in group home, seperation from wife) Persecutory voices/beliefs?: No Depression:  Yes Depression Symptoms: Despondent, Insomnia, Tearfulness, Isolating, Fatigue, Guilt, Loss of interest in usual pleasures, Feeling worthless/self pity Substance abuse history and/or treatment for substance abuse?: Yes Suicide prevention information given to non-admitted patients: Yes  Risk to Others within the past 6 months Homicidal Ideation: No Does patient have any lifetime risk of violence toward others beyond the six months prior to admission? : No Thoughts of Harm to Others: No Current Homicidal Intent: No Current Homicidal Plan: No Access to Homicidal Means: No Identified Victim: none History of harm to others?: No Assessment of Violence: None Noted Violent Behavior Description: none noted Does patient have access to weapons?: No Criminal Charges Pending?: No Does patient have a court date: No Is patient on probation?: No  Psychosis Hallucinations: Visual (pt states after alcohol binge VH is present) Delusions: None noted  Mental Status Report Appearance/Hygiene: Unremarkable Eye Contact: Poor Motor Activity: Unremarkable Speech: Logical/coherent Level of Consciousness: Alert, Crying Mood: Depressed, Anxious Affect: Anxious, Depressed Anxiety Level: Panic Attacks Panic attack frequency:  (unspecified) Most recent panic attack: 11/13/15 Thought Processes: Coherent, Relevant Judgement: Impaired Orientation: Person, Place, Time, Situation, Appropriate for developmental age Obsessive Compulsive Thoughts/Behaviors: None  Cognitive Functioning Concentration: Decreased Memory: Recent Intact, Remote Intact IQ: Average Insight: Poor Impulse Control: Poor Appetite: Fair Weight Loss: 0 Weight Gain: 0 Sleep: Decreased Total Hours of Sleep: 2 Vegetative Symptoms: None  ADLScreening [facility] Assessment Services) Patient's cognitive ability adequate to safely complete daily activities?: Yes Patient able to express need for assistance with ADLs?: Yes Independently performs  ADLs?:  Yes (appropriate for developmental age)  Prior Inpatient Therapy Prior Inpatient Therapy: No Prior Therapy Dates: n/a Prior Therapy Facilty/Provider(s): n/a Reason for Treatment: n/a  Prior Outpatient Therapy Prior Outpatient Therapy: No Prior Therapy Dates: n/a Prior Therapy Facilty/Provider(s): n/a Reason for Treatment: n/a Does patient have an ACCT team?: No Does patient have Intensive In-House Services?  : No Does patient have [facility] services? : No Does patient have P4CC services?: No  ADL Screening (condition at time of admission) Patient's cognitive ability adequate to safely complete daily activities?: Yes Is the patient deaf or have difficulty hearing?: No Does the patient have difficulty seeing, even when wearing glasses/contacts?: No Does the patient have difficulty concentrating, remembering, or making decisions?: No Patient able to express need for assistance with ADLs?: Yes Does the patient have difficulty dressing or bathing?: No Independently performs ADLs?: Yes (appropriate for developmental age) Does the patient have difficulty walking or climbing stairs?: No Weakness of Legs: None Weakness of Arms/Hands: None  Home Assistive Devices/Equipment Home Assistive Devices/Equipment: None    Abuse/Neglect Assessment (Assessment to be complete while patient is alone) Physical Abuse: Denies Verbal Abuse: Denies Sexual Abuse: Denies Exploitation of patient/patient's resources: Denies Self-Neglect: Denies Values / Beliefs Cultural Requests During Hospitalization: None Spiritual Requests During Hospitalization: None   Advance Directives (For Healthcare) Does patient have an advance directive?: No Would patient like information on creating an advanced directive?: No - patient declined information    Additional Information 1:1 In Past 12 Months?: No CIRT Risk: No Elopement Risk: No Does patient have medical clearance?: No     Disposition: Per  [place], DNP meets inpt criteria pending medical clearance. Disposition Initial Assessment Completed for this Encounter: Yes Disposition of Patient: Inpatient treatment program Type of inpatient treatment program: Adult  Hipolito Bayley 11/14/2015 9:30 AM

## 2015-11-14 NOTE — BH Assessment (Signed)
[facility] Assessment Progress Note  Per Thedore MinsMojeed Akintayo, MD, this pt requires psychiatric hospitalization at this time.  Lillia AbedLindsay, RN, [facility].  Pt has signed Voluntary Admission and Consent for Treatment, as well as Consent to Release Information to no one, and signed forms [facility].  Pt's nurse has been notified, and agrees to send original paperwork along with pt via [place], and to call report to [phone].  Doylene Canninghomas Brandt Chaney, MA Triage Specialist [phone]

## 2015-11-14 NOTE — ED Notes (Addendum)
Pt presents with c/o request for medical clearance. [facility] once medically cleared, reporting depression, anxiety, and request for detox from alcoholism. Denies any SI or HI at this time.

## 2015-11-14 NOTE — Tx Team (Signed)
Initial Interdisciplinary Treatment Plan   PATIENT STRESSORS: Financial difficulties Health problems Marital or family conflict Substance abuse   PATIENT STRENGTHS: Capable of independent living Manufacturing systems engineerCommunication skills Motivation for treatment/growth Supportive family/friends   PROBLEM LIST: Problem List/Patient Goals Date to be addressed Date deferred Reason deferred Estimated date of resolution  At risk for suicide 11/14/2015  11/14/2015   D/C  Substance Abuse 11/14/2015  11/14/2015   D/C  "Not drinking" 11/14/2015  11/14/2015   D/C  "Feeling better" 11/14/2015  11/14/2015   D/C                                 DISCHARGE CRITERIA:  Ability to meet basic life and health needs Adequate post-discharge living arrangements Improved stabilization in mood, thinking, and/or behavior Medical problems require only outpatient monitoring Motivation to continue treatment in a less acute level of care Need for constant or close observation no longer present Reduction of life-threatening or endangering symptoms to within safe limits Withdrawal symptoms are absent or subacute and managed without 24-hour nursing intervention  PRELIMINARY DISCHARGE PLAN: Attend 12-step recovery group Return to previous living arrangement Return to previous work or school arrangements  PATIENT/FAMIILY INVOLVEMENT: This treatment plan has been presented to and reviewed with the patient, Scott Chandler.  The patient and family have been given the opportunity to ask questions and make suggestions.  Larry SierrasMiddleton, Gregoria Selvy P 11/14/2015, 4:31 PM

## 2015-11-15 ENCOUNTER — Encounter (HOSPITAL_COMMUNITY): Payer: Self-pay | Admitting: Psychiatry

## 2015-11-15 DIAGNOSIS — F10239 Alcohol dependence with withdrawal, unspecified: Secondary | ICD-10-CM

## 2015-11-15 MED ORDER — TRAZODONE HCL 50 MG PO TABS
50.0000 mg | ORAL_TABLET | Freq: Every evening | ORAL | Status: DC | PRN
  Administered 2015-11-15 – 2015-11-17 (×3): 50 mg via ORAL
  Filled 2015-11-15 (×3): qty 1

## 2015-11-15 MED ORDER — DULOXETINE HCL 20 MG PO CPEP
40.0000 mg | ORAL_CAPSULE | Freq: Every day | ORAL | Status: DC
  Administered 2015-11-16 – 2015-11-18 (×3): 40 mg via ORAL
  Filled 2015-11-15 (×5): qty 2

## 2015-11-15 NOTE — Tx Team (Signed)
Interdisciplinary Treatment Plan Update (Adult)  Date:  11/15/2015  Time Reviewed:  8:34 AM   Progress in Treatment: Attending groups: No. New to unit. Continuing to assess.  Participating in groups:  No. Taking medication as prescribed:  Yes. Tolerating medication:  Yes. Family/Significant othe contact made:  SPE required for this pt.  Patient understands diagnosis:  Yes. and As evidenced by:  seeking treatment for ETOH abuse, depression, SI with plan, and for medication stabilization.  Discussing patient identified problems/goals with staff:  Yes. Medical problems stabilized or resolved:  Yes. Denies suicidal/homicidal ideation: Passive SI/able to contract for safety on the unit.  Issues/concerns per patient self-inventory:  Other:  Discharge Plan or Barriers: CSW assessing for appropriate referrals. Currently, pt sees his PCP at [facility] for medication management (zoloft). No mental health providers.   Reason for Continuation of Hospitalization: Depression Medication stabilization Suicidal ideation Withdrawal symptoms  Comments:  Scott Chandler is an 40 y.o. male, Caucasian, Married who presents to [facility] as walk in for depression and SI. Patient states that his primary concern is of increasing depression with ETOH and binge drinking with current complaint of severe abdominal cramping and hx. Of lower back problems. Patient denies history of psychotic symptoms. Patient wife Gwendolyn Nishi present during assessment. Patient states that he last drank alcohol this morning with unknown amount. Per wife, patient has SI with psychological problems. Patient states that he has had recent decline in sleep with 2 hours per night in past month. Historically, pt. States that he has a daughter who is 43 and she was sent to a group home, along with separation from wife in past month. Patient states that he has started isolating self and drinking heavily within past month. Patient  currently stays by self.Patient denies current [during assessment] SI, but acknowledges SI with plan to hang self in closet with belt. Patient denies history of SI or HI. Patient deneis history or current AVH, but acknowledges VH after drinking binge. Patient denies history of inpatient or outpatient psychiatric care, but states that he does see primary care provider at [facility] and was prescribed Zoloft.  With alcohol starting at or around age 48 consuming unknown amounts with known average of 1/5 liquor/ vodka per day for years. Longest period of sobriety was x 1 day months ago from present date.  Diagnosis: Unspecified Depressive Disorder, Alcohol Use Disorder, Severe  Estimated length of stay:  3-5 days   New goal(s): to develop effective aftercare plan.   Additional Comments:  Patient and CSW reviewed pt's identified goals and treatment plan. Patient verbalized understanding and agreed to treatment plan. CSW reviewed [facility] "Discharge Process and Patient Involvement" Form. Pt verbalized understanding of information provided and signed form.    Review of initial/current patient goals per problem list:  1. Goal(s): Patient will participate in aftercare plan  Met: No.   Target date: at discharge  As evidenced by: Patient will participate within aftercare plan AEB aftercare provider and housing plan at discharge being identified.  7/21: CSW assessing for appropriate referrals.   2. Goal (s): Patient will exhibit decreased depressive symptoms and suicidal ideations.  Met: No.    Target date: at discharge  As evidenced by: Patient will utilize self rating of depression at 3 or below and demonstrate decreased signs of depression or be deemed stable for discharge by MD.  7/21: Pt rates depression as high. Passive SI/Able to contract for safety on the unit. No HI/AVH reported by  pt.   3. Goal(s): Patient will demonstrate decreased signs of withdrawal due to  substance abuse  Met:No.   Target date:at discharge   As evidenced by: Patient will produce a CIWA/COWS score of 0, have stable vitals signs, and no symptoms of withdrawal.  7/21: Pt reports moderate withdrawals with no CIWA score taken; high standing BP. All other vitals are stable.   Attendees: Patient:   11/15/2015 8:34 AM   Family:   11/15/2015 8:34 AM   Physician:  Dr. Parke Poisson MD  11/15/2015 8:34 AM   Nursing:   Reita May RN 11/15/2015 8:34 AM   Clinical Social Worker: Maxie Better, LCSW 11/15/2015 8:34 AM   Clinical Social Worker:Lauren Madie Reno 11/15/2015 8:34 AM   Other:  Gerline Legacy Nurse Case Manager 11/15/2015 8:34 AM   Other:  Agustina Caroli NP; May Augustin NP  11/15/2015 8:34 AM   Other:   11/15/2015 8:34 AM   Other:  11/15/2015 8:34 AM   Other:  11/15/2015 8:34 AM   Other:  11/15/2015 8:34 AM    11/15/2015 8:34 AM    11/15/2015 8:34 AM    11/15/2015 8:34 AM    11/15/2015 8:34 AM    Scribe for Treatment Team:   Maxie Better, LCSW 11/15/2015 8:34 AM

## 2015-11-15 NOTE — Progress Notes (Signed)
Patient did not attend the evening speaker AA meeting. Pt was notified that group was beginning but reported " I don't need that, that is not for me, I just want my sleep medicine so I can go to sleep".  Pt was made aware that sleep medication is available after 2100.

## 2015-11-15 NOTE — [facility] Group Notes (Signed)
[facility] LCSW Group Therapy  11/15/2015 3:46 PM  Type of Therapy:  Group Therapy  Participation Level:  Minimal   Participation Quality:  Inattentive  Affect:  Appropriate  Cognitive:  Oriented  Insight:  Limited  Engagement in Therapy:  Limited  Modes of Intervention:  Confrontation, Discussion, Education, Exploration, Problem-solving, Rapport Building, Socialization and Support  Summary of Progress/Problems: Feelings around Relapse. Group members discussed the meaning of relapse and shared personal stories of relapse, how it affected them and others, and how they perceived themselves during this time. Group members were encouraged to identify triggers, warning signs and coping skills used when facing the possibility of relapse. Social supports were discussed and explored in detail. [name] was inattentive and disengaged during group but did stay in group for its entirety. Patient requested that CSW check to see if his phone was in his locker and stated that he wanted to discharge by Monday. [name] does not appear to be making progress in the group setting at this time and demonstrates limited insight.   Smart, Charlisha Market LCSW 11/15/2015, 3:46 PM

## 2015-11-15 NOTE — [facility] Suicide Risk Assessment (Signed)
[facility] INPATIENT:  Family/Significant Other Suicide Prevention Education  Suicide Prevention Education:  Patient Refusal for Family/Significant Other Suicide Prevention Education: The patient Scott Chandler has refused to provide written consent for family/significant other to be provided Family/Significant Other Suicide Prevention Education during admission and/or prior to discharge.  Physician notified.  SPE completed with pt, as pt refused to consent to family contact. SPI pamphlet provided to pt and pt was encouraged to share information with support network, ask questions, and talk about any concerns relating to SPE. Pt denies access to guns/firearms and verbalized understanding of information provided. Mobile Crisis information also provided to pt.   Smart, Atlee Kluth LCSW 11/15/2015, 10:58 AM

## 2015-11-15 NOTE — Progress Notes (Signed)
D    Pt interacts in the dayroom but did not want to go to group   He requested his medications at 9 p and asked for everything he could have    Pt lacks insight into his illness and laughs like it is a joke when talking about treatment and requests all the pill he can have of shots of etoh A   Verbal support given   Encouraged pt being vested in his treatment    Medications administered and effectiveness monitored   Q 15 min checks R   Pt is safe at present

## 2015-11-15 NOTE — Progress Notes (Addendum)
Data. Patient denies HI/VH. He does report SI, "On and off, all the time". He is able to give a verbal agreement to come to staff and let us know when/if he begins to feel like his SI will cause him to hurt himself. Does report, "lights that bend and move and become other things. But they aren't as bad as they have been in the past."   Patient interacting well with staff and other patients. When he is at the medication window he makes statements like, "Can you get me some vodka", and "What drugs can you get me?" Emphasis on the "drugs".  Reports pain through out day. Action. Emotional support and encouragement offered. Education provided on medication, indications and side effect. Q 15 minute checks done for safety. PRN pain meds given as scheduled/requested. Response. Safety on the unit maintained through 15 minute checks.  Medications taken as prescribed. Attended groups. Remained calm and appropriate through out shift. Decrease in pain with medication reported.

## 2015-11-15 NOTE — H&P (Signed)
Psychiatric Admission Assessment Adult  Patient Identification: Scott Chandler MRN:  [id number] Date of Evaluation:  11/15/2015 Chief Complaint: " I have been drinking a lot " Principal Diagnosis:  Alcohol Dependence,Alcohol Induced Mood Disorder, Depressed  Diagnosis:   Patient Active Problem List   Diagnosis Date Noted  . Alcohol dependence with withdrawal with complication ([place]) [Y30.160] 11/14/2015  . Alcohol-induced mood disorder ([place]) [F10.94] 11/14/2015  . EPIGASTRIC PAIN [R10.13] 10/22/2009  . FECAL OCCULT BLOOD [R19.5] 10/22/2009  . HYPERLIPIDEMIA [E78.5] 10/21/2009  . ESOPHAGITIS, HX OF [Z87.19] 10/21/2009  . GERD [K21.9] 05/31/2008  . CHEST PAIN [R07.9] 05/31/2008  . DIARRHEA [R19.7] 05/31/2008   History of Present Illness:40 year old man. History of alcohol dependence. He states that he has recently been drinking more over the last few weeks. Reports multiple stressors, including 42 year old daughter being in a group home, and using cocaine, wife having history of MS, states " I actually have to go to work in order to relax ". States he has been drinking more than a case of beer per day. States " I run out of the beer at night, and I wake up in withdrawals". States that in the context of these stressors and heavy alcohol use he has been feeling progressively more depressed, and was feeling suicidal , with thoughts of hanging himself . States he came to the hospital of his own accord, because " I knew I needed help"  Reports he last drank yesterday in AM. Admission BAL 21.  Associated Signs/Symptoms: Depression Symptoms:  depressed mood, anhedonia, insomnia, suicidal thoughts with specific plan, loss of energy/fatigue, decreased appetite, has lost about 10 lbs over last two weeks  (Hypo) Manic Symptoms:   Denies  Anxiety Symptoms:   States he worries excessively, " I feel like I worry about everything " Psychotic Symptoms:   Denies   PTSD Symptoms: denies   Total Time  spent with patient:  45 minutes   Past Psychiatric History: this is his first psychiatric admission, no history of suicide attempts, denies history of self cutting, or self injurious ideations, denies history of violence. Does not describe any clear history of mania or hypomania .   Is the patient at risk to self? Yes.    Has the patient been a risk to self in the past 6 months? Yes- Has the patient been a risk to self within the distant past? No.  Is the patient a risk to others? No.  Has the patient been a risk to others in the past 6 months? No.  Has the patient been a risk to others within the distant past? No.   Prior Inpatient Therapy:  denies  Prior Outpatient Therapy:  no   Alcohol Screening: 1. How often do you have a drink containing alcohol?: 4 or more times a week 2. How many drinks containing alcohol do you have on a typical day when you are drinking?: 10 or more 3. How often do you have six or more drinks on one occasion?: Daily or almost daily Preliminary Score: 8 4. How often during the last year have you found that you were not able to stop drinking once you had started?: Daily or almost daily 5. How often during the last year have you failed to do what was normally expected from you becasue of drinking?: Less than monthly 6. How often during the last year have you needed a first drink in the morning to get yourself going after a heavy drinking session?: Daily  or almost daily 7. How often during the last year have you had a feeling of guilt of remorse after drinking?: Daily or almost daily 8. How often during the last year have you been unable to remember what happened the night before because you had been drinking?: Less than monthly 9. Have you or someone else been injured as a result of your drinking?: No 10. Has a relative or friend or a doctor or another health worker been concerned about your drinking or suggested you cut down?: Yes, during the last year Alcohol Use  Disorder Identification Test Final Score (AUDIT): 30 Brief Intervention: Yes Substance Abuse History in the last 12 months: alcohol dependence, as above. States he has a remote history of abusing cocaine and cannabis, but states he has not used drugs in years . Consequences of Substance Abuse: No history of DUI, history of some blackouts, denies history of seizures, states he has had " bad withdrawals", and describes  transient visual disturbances, hallucinations when detoxing at times, but not full blown episode of DTs .  Longest period of sobriety 2-3 months . Previous Psychotropic Medications: states he has been on Zoloft, since November 2016, causes sexual side effects. States he stopped taking it 1-2 weeks ago. In the past has been on Ativan, but not recently . In the past has tried Naltrexone , but states "it really does not work that well" and has not been taking it recently . Psychological Evaluations:  No  Past Medical History: History of hiatal hernia, history of back pain due to " bulging discs ". Past Medical History  Diagnosis Date  . GERD (gastroesophageal reflux disease)   . Chest pain     Past Surgical History  Procedure Laterality Date  . Transthoracic echocardiogram  05/18/2008    normal LVF/ mild LVH/trace mitral and tricuspid regurgitation   Family History: parents divorced when he was a young boy, both are deceased , father died from CHF, mother died from Breast Cancer . Has two half siblings , one half brother died in a MVA  Family History  Problem Relation Age of Onset  . Hypertension Father   . Cancer Mother    Family Psychiatric  History: states mother had a " nervous break down" once, had history of depression, no suicides in family, states his adolescent daughter has been diagnosed with bipolar disorder, grandfather was alcoholic  Tobacco Screening:  Does not smoke  Social History:  Married x 2, has one daughter, aged 8. Employed [employer] employee), denies legal  issues, describes some financial concerns , due to student debt .  History  Alcohol Use  . Yes     History  Drug Use Not on file    Additional Social History:  Allergies:  No Known Allergies Lab Results:  Results for orders placed or performed during the hospital encounter of 11/14/15 (from the past 48 hour(s))  Acetaminophen level     Status: Abnormal   Collection Time: 11/14/15 11:59 AM  Result Value Ref Range   Acetaminophen (Tylenol), Serum <10 (L) 10 - 30 ug/mL    Comment:        THERAPEUTIC CONCENTRATIONS VARY SIGNIFICANTLY. A RANGE OF 10-30 ug/mL MAY BE AN EFFECTIVE CONCENTRATION FOR MANY PATIENTS. HOWEVER, SOME ARE BEST TREATED AT CONCENTRATIONS OUTSIDE THIS RANGE. ACETAMINOPHEN CONCENTRATIONS >150 ug/mL AT 4 HOURS AFTER INGESTION AND >50 ug/mL AT 12 HOURS AFTER INGESTION ARE OFTEN ASSOCIATED WITH TOXIC REACTIONS.   Comprehensive metabolic panel     Status:  Abnormal   Collection Time: 11/14/15 11:59 AM  Result Value Ref Range   Sodium 137 135 - 145 mmol/L   Potassium 4.2 3.5 - 5.1 mmol/L   Chloride 99 (L) 101 - 111 mmol/L   CO2 26 22 - 32 mmol/L   Glucose, Bld 83 65 - 99 mg/dL   BUN 9 6 - 20 mg/dL   Creatinine, Ser 0.81 0.61 - 1.24 mg/dL   Calcium 9.7 8.9 - 10.3 mg/dL   Total Protein 8.2 (H) 6.5 - 8.1 g/dL   Albumin 4.7 3.5 - 5.0 g/dL   AST 36 15 - 41 U/L   ALT 38 17 - 63 U/L   Alkaline Phosphatase 66 38 - 126 U/L   Total Bilirubin 0.7 0.3 - 1.2 mg/dL   GFR calc non Af Amer >60 >60 mL/min   GFR calc Af Amer >60 >60 mL/min    Comment: (NOTE) The eGFR has been calculated using the CKD EPI equation. This calculation has not been validated in all clinical situations. eGFR's persistently <60 mL/min signify possible Chronic Kidney Disease.    Anion gap 12 5 - 15  Ethanol     Status: Abnormal   Collection Time: 11/14/15 11:59 AM  Result Value Ref Range   Alcohol, Ethyl (B) 21 (H) <5 mg/dL    Comment:        LOWEST DETECTABLE LIMIT FOR SERUM ALCOHOL IS  5 mg/dL FOR MEDICAL PURPOSES ONLY   Salicylate level     Status: None   Collection Time: 11/14/15 11:59 AM  Result Value Ref Range   Salicylate Lvl <2.8 2.8 - 30.0 mg/dL  CBC with Differential     Status: Abnormal   Collection Time: 11/14/15 11:59 AM  Result Value Ref Range   WBC 9.0 4.0 - 10.5 K/uL   RBC 5.72 4.22 - 5.81 MIL/uL   Hemoglobin 17.7 (H) 13.0 - 17.0 g/dL   HCT 50.4 39.0 - 52.0 %   MCV 88.1 78.0 - 100.0 fL   MCH 30.9 26.0 - 34.0 pg   MCHC 35.1 30.0 - 36.0 g/dL   RDW 13.0 11.5 - 15.5 %   Platelets 251 150 - 400 K/uL   Neutrophils Relative % 57 %   Neutro Abs 5.2 1.7 - 7.7 K/uL   Lymphocytes Relative 28 %   Lymphs Abs 2.5 0.7 - 4.0 K/uL   Monocytes Relative 13 %   Monocytes Absolute 1.1 (H) 0.1 - 1.0 K/uL   Eosinophils Relative 1 %   Eosinophils Absolute 0.1 0.0 - 0.7 K/uL   Basophils Relative 1 %   Basophils Absolute 0.1 0.0 - 0.1 K/uL  Lipase, blood     Status: None   Collection Time: 11/14/15 11:59 AM  Result Value Ref Range   Lipase 45 11 - 51 U/L  Urine rapid drug screen (hosp performed)     Status: None   Collection Time: 11/14/15 12:12 PM  Result Value Ref Range   Opiates NONE DETECTED NONE DETECTED   Cocaine NONE DETECTED NONE DETECTED   Benzodiazepines NONE DETECTED NONE DETECTED   Amphetamines NONE DETECTED NONE DETECTED   Tetrahydrocannabinol NONE DETECTED NONE DETECTED   Barbiturates NONE DETECTED NONE DETECTED    Comment:        DRUG SCREEN FOR MEDICAL PURPOSES ONLY.  IF CONFIRMATION IS NEEDED FOR ANY PURPOSE, NOTIFY LAB WITHIN 5 DAYS.        LOWEST DETECTABLE LIMITS FOR URINE DRUG SCREEN Drug Class       Cutoff (  ng/mL) Amphetamine      1000 Barbiturate      200 Benzodiazepine   786 Tricyclics       767 Opiates          300 Cocaine          300 THC              50     Blood Alcohol level:  Lab Results  Component Value Date   ETH 21* 20/94/7096    Metabolic Disorder Labs:  No results found for: HGBA1C, MPG No results found  for: PROLACTIN No results found for: CHOL, TRIG, HDL, CHOLHDL, VLDL, LDLCALC  Current Medications: Current Facility-Administered Medications  Medication Dose Route Frequency Provider Last Rate Last Dose  . acetaminophen (TYLENOL) tablet 650 mg  650 mg Oral Q6H PRN Patrecia Pour, NP   650 mg at 11/15/15 0837  . alum & mag hydroxide-simeth (MAALOX/MYLANTA) 200-200-20 MG/5ML suspension 30 mL  30 mL Oral Q4H PRN Patrecia Pour, NP      . hydrOXYzine (ATARAX/VISTARIL) tablet 25 mg  25 mg Oral Q6H PRN Patrecia Pour, NP      . loperamide (IMODIUM) capsule 2-4 mg  2-4 mg Oral PRN Patrecia Pour, NP   4 mg at 11/15/15 0843  . LORazepam (ATIVAN) tablet 1 mg  1 mg Oral Q6H PRN Patrecia Pour, NP      . LORazepam (ATIVAN) tablet 1 mg  1 mg Oral TID Patrecia Pour, NP   1 mg at 11/15/15 0836   Followed by  . [START ON 11/16/2015] LORazepam (ATIVAN) tablet 1 mg  1 mg Oral BID Patrecia Pour, NP       Followed by  . [START ON 11/17/2015] LORazepam (ATIVAN) tablet 1 mg  1 mg Oral Daily Patrecia Pour, NP      . magnesium hydroxide (MILK OF MAGNESIA) suspension 30 mL  30 mL Oral Daily PRN Patrecia Pour, NP      . multivitamin with minerals tablet 1 tablet  1 tablet Oral Daily Patrecia Pour, NP   1 tablet at 11/15/15 [phone]  . ondansetron (ZOFRAN-ODT) disintegrating tablet 4 mg  4 mg Oral Q6H PRN Patrecia Pour, NP      . propranolol (INDERAL) tablet 20 mg  20 mg Oral BID PRN Patrecia Pour, NP      . sertraline (ZOLOFT) tablet 100 mg  100 mg Oral Q breakfast Patrecia Pour, NP   100 mg at 11/15/15 0836  . thiamine (VITAMIN B-1) tablet 100 mg  100 mg Oral Daily Patrecia Pour, NP   100 mg at 11/15/15 [id number]   PTA Medications: Prescriptions prior to admission  Medication Sig Dispense Refill Last Dose  . ibuprofen (ADVIL,MOTRIN) 200 MG tablet Take 400-600 mg by mouth every 6 (six) hours as needed for fever, headache, mild pain, moderate pain or cramping.   11/13/2015 at Unknown time  . naltrexone (DEPADE) 50 MG  tablet Take 50 mg by mouth daily.   Past Month at Unknown time  . Omega-3 Fatty Acids (FISH OIL) 1000 MG CAPS Take 2 capsules by mouth daily.   Past Week at Unknown time  . propranolol (INDERAL) 20 MG tablet Take 20 mg by mouth 2 (two) times daily as needed (for anxiety).   11/13/2015 at Unknown time  . sertraline (ZOLOFT) 100 MG tablet Take 100 mg by mouth daily with breakfast.   Past Week at Unknown time  Musculoskeletal: Strength & Muscle Tone: within normal limitsno tremors, no diaphoresis, no psychomotor restlessness, (+) facial flushing  Gait & Station: normal Patient leans: N/A  Psychiatric Specialty Exam: Physical Exam  Review of Systems  Constitutional: Negative.   HENT: Negative.   Eyes: Negative.   Respiratory: Negative.   Cardiovascular: Negative.   Gastrointestinal: Positive for heartburn, nausea and diarrhea. Negative for vomiting, abdominal pain, blood in stool and melena.  Genitourinary: Negative.   Musculoskeletal: Positive for back pain.  Skin: Negative.   Neurological: Negative for seizures.  Endo/Heme/Allergies: Negative.   Psychiatric/Behavioral: Positive for depression, suicidal ideas and substance abuse.  All other systems reviewed and are negative.   Blood pressure 123/95, pulse 92, temperature 97.9 F (36.6 C), temperature source Oral, resp. rate 20, height 5' 11"  (1.803 m), weight 232 lb (105.235 kg), SpO2 96 %.Body mass index is 32.37 kg/(m^2).  General Appearance: Well Groomed  Eye Contact:  Good  Speech:  Normal Rate  Volume:  Normal  Mood:  Depressed and Irritable  Affect:  Constricted, irritable   Thought Process:  Linear  Orientation:  Full (Time, Place, and Person)  Thought Content:  Denies any hallucinations,no delusions , not internally preoccupied   Suicidal Thoughts:  No denies any suicidal ideations at this time, contracts for safety on the unit   Homicidal Thoughts:  No denies any homicidal ideations   Memory:  recent and remote grossly  intact   Judgement:  Fair  Insight:  Fair  Psychomotor Activity:  Normal  Concentration:  Concentration: Good and Attention Span: Good  Recall:  Good  Fund of Knowledge:  Good  Language:  Good  Akathisia:  Negative  Handed:  Right  AIMS (if indicated):     Assets:  Communication Skills Desire for Improvement Resilience  ADL's:  Intact  Cognition:  WNL  Sleep:  Number of Hours: 5.5       Treatment Plan Summary: Daily contact with patient to assess and evaluate symptoms and progress in treatment, Medication management, Plan inpatient admission  and medications as below   Observation Level/Precautions:  15 minute checks  Laboratory:   As needed - will order TSH  Psychotherapy: milieu, support    Medications:  Continue Ativan  Detox for alcohol WDL , Patient states that Zoloft does not work much for him , but does feel he would benefit from an antidepressant- we discussed alternatives and agrees to Cymbalta trial  Consultations:  As needed   Discharge Concerns:  -  Estimated LOS: 5 days   Other:     I certify that inpatient services furnished can reasonably be expected to improve the patient's condition.    Neita Garnet, MD 7/21/201710:25 AM

## 2015-11-15 NOTE — BHH Counselor (Signed)
Adult Comprehensive Assessment  Patient ID: Scott Chandler, male   DOB: Sep 08, 1975, 40 y.o.   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  Housing / Lack of housing: was living with his wife Physical health (include injuries & life threatening diseases): "bulging discs in lower back; 3 compressed vertabrae."  Social relationships: some close friends and family that are supportive. strained from "my current wife." poor relationship with his daughter 31).  Substance abuse: alcohol abuse-"I've been drinking hundreds of beers in a week. About 48 beers a day. I withdraw at night." "I drink until I black out."  Bereavement / Loss: separted from wife; daughter recently transitioned to group home. "my exwife/mother of my daughter was murdered 2 years ago."   Living/Environment/Situation:  Living Arrangements: Spouse/significant other Living conditions (as described by patient or guardian): living with wife How long has patient lived in current situation?: several years  What is atmosphere in current home: Chaotic  Family History:  Marital status: Separated Number of Years Married: 6 Separated, when?: month or two ago. Pt reports having a "new girlfriend."  What types of issues is patient dealing with in the relationship?: "My wife has MS and uses it to be lazy." daughter went to group home recently. Alcohol abuse gets in way of relationship. Additional relationship information: Pt mentioned that he has a "new girlfriend" and complained that zoloft is causing negative sexual side effects.  Are you  sexually active?: Yes What is your sexual orientation?: heterosexual Has your sexual activity been affected by drugs, alcohol, medication, or emotional stress?: yes-drinking binges.  Does patient have children?: Yes How many children?: 1 How is patient's relationship with their children?: 4 year old daughter has been cutting and tested positive for cociane. She recently went to group home due to behavioral issues.   Childhood History:  By whom was/is the patient raised?: Both parents Additional childhood history information: "my parents divorced when I was little. " "I had a good life as a kid." Description of patient's relationship with caregiver when they were a child: close to both parents Patient's description of current relationship with people who raised him/her: "they are both dead. My dad died in 09/14/2003 and mom died of cancer in 35).  How were you disciplined when you got in trouble as a child/adolescent?: n/a  Does patient have siblings?: Yes Number of Siblings: 2 Description of patient's current relationship with siblings: half brother and half sister. "I did have another half brother but he died as a baby in a car accident."  Did patient suffer any verbal/emotional/physical/sexual abuse as a child?: No Did patient suffer from severe childhood neglect?: No Has patient ever been sexually abused/assaulted/raped as an adolescent or adult?: No Was the patient ever a victim of a crime or a disaster?: No Witnessed domestic violence?: No Has patient been effected by domestic violence as an adult?: No  Education:  Highest grade of school patient has completed: BS in college Currently a student?: No Name of school: n/a  Learning disability?: No  Employment/Work Situation:   Employment situation: Employed 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  Has patient ever been in the [employer] and Companymilitary?: No Has patient ever served in combat?: No Did You Receive Any Psychiatric Treatment/Services While in the [employer]?: No Are There Guns or Other Weapons in Your Home?: No Are These Weapons Safely Secured?:  (n/a)  Financial Resources:   Financial resources: Income from employment, Media plannerrivate insurance, Income from spouse Does patient have a representative payee or guardian?: No  Alcohol/Substance Abuse:   What has been your use of drugs/alcohol within the last 12 months?: heavy ETOH abuse--over a case of beer daily; "I've drunk hundreds of beers in the past two weeks." "I never buy enough." If attempted suicide, did drugs/alcohol play a role in this?: Yes (before coming in, pt reports he was intoxicated and had a plan to hang himself with a belt in his closet. ) Alcohol/Substance Abuse Treatment Hx: Denies past history, Past Tx, Outpatient If yes, describe treatment: pt reports that he went on drinking binge Thanksgiving of 2016 and saw his PCP afterward due to this event.  Has alcohol/substance abuse ever caused legal problems?: No  Social Support System:   Patient's Community Support System: [employer]/gallery exhibitions officerDescribe Community Support System: some good friends in community; some good family supports Type of faith/religion: n/a  How does patient's faith help to cope with current illness?: n/a   Leisure/Recreation:   Leisure and Hobbies: "I don't do much but drink and go to work at this point."   Strengths/Needs:   What things does the patient do well?: work hard; wanting to get help for depression and alcohol addiction In what areas does patient struggle / problems for patient: alcohol abuse; handling stress/emotional regulation. SI  thoughts. "I think I'm about to have a mental breakdown."   Discharge Plan:   Does patient have access to transportation?: Yes Will patient be returning to same living situation after discharge?: Yes Currently receiving community mental health services: Yes (From Whom) (PCP prescribes zoloft. "I don't like the sexual side effects. I took 100mg  per day but have been weaing myself off." ) If no, would patient like referral for services when discharged?: Yes (What county?) [facility]([place]) Does patient have financial barriers related to discharge medications?: No (private insurance and income from employment)  Summary/Recommendations:   Emergency planning/management officerummary and Recommendations (to be completed by the evaluator): [place] [age] year [place] He presents to the hospital seeking treatment for severe ETOH abuse, increased depression, SI with plan, and for medication stabilization. Patient reports multiple stressors including: marital issues, problems with 42 yo daughter. Patient reports heavy drinking for years and was placed on Zoloft 100mg  in November 2016 by his PCP. Recommendations for patient include: crisis stabilization, therapeutic milieu, encourage group attendance and participation, medication management for withdrawals/mood stabilization, and development of comprehensive mental wellness/sobriety plan. CSW assessing for appropriate referrals.   Smart, Travia Onstad LCSW 11/15/2015 10:44 AM

## 2015-11-15 NOTE — [facility] Suicide Risk Assessment (Signed)
[facility]   Nursing information obtained from:  Patient Demographic factors:  Male, Caucasian, Living alone Current Mental Status:  Suicidal ideation indicated by patient, Suicide plan, Plan includes specific time, place, or method, Self-harm thoughts, Self-harm behaviors Loss Factors:  Loss of significant relationship, Decline in physical health, Financial problems / change in socioeconomic status Historical Factors:  Family history of mental illness or substance abuse, Anniversary of important loss Risk Reduction Factors:  Employed, Positive social support  Total Time spent with patient: 45 minutes Principal Problem: Alcohol Dependence  Diagnosis:   Patient Active Problem List   Diagnosis Date Noted  . Alcohol dependence with withdrawal with complication (HCC) [F10.239] 11/14/2015  . Alcohol-induced mood disorder (HCC) [F10.94] 11/14/2015  . EPIGASTRIC PAIN [R10.13] 10/22/2009  . FECAL OCCULT BLOOD [R19.5] 10/22/2009  . HYPERLIPIDEMIA [E78.5] 10/21/2009  . ESOPHAGITIS, HX OF [Z87.19] 10/21/2009  . GERD [K21.9] 05/31/2008  . CHEST PAIN [R07.9] 05/31/2008  . DIARRHEA [R19.7] 05/31/2008     Continued Clinical Symptoms:  Alcohol Use Disorder Identification Test Final Score (AUDIT): 30 The "Alcohol Use Disorders Identification Test", Guidelines for Use in Primary Care, Second Edition.  World Science writerHealth Organization [facility](WHO). Score between 0-7:  no or low risk or alcohol related problems. Score between 8-15:  moderate risk of alcohol related problems. Score between 16-19:  high risk of alcohol related problems. Score 20 or above:  warrants further diagnostic evaluation for alcohol dependence and treatment.   CLINICAL FACTORS:  40 year old male, history of alcohol dependence, has been drinking heavily over recent weeks, reports worsening symptoms of depression, with recent suicidal ideations and (+) neuro-vegetative symptoms of depression. At this time presenting  with mild to moderate WDL, but no current symptoms of DTs .     Psychiatric Specialty Exam: Physical Exam  ROS  Blood pressure 123/95, pulse 92, temperature 97.9 F (36.6 C), temperature source Oral, resp. rate 20, height 5\' 11"  (1.803 m), weight 232 lb (105.235 kg), SpO2 96 %.Body mass index is 32.37 kg/(m^2).   see admit note MSE                                                         COGNITIVE FEATURES THAT CONTRIBUTE TO RISK:  Closed-mindedness and Loss of executive function    SUICIDE RISK:   Moderate:  Frequent suicidal ideation with limited intensity, and duration, some specificity in terms of plans, no associated intent, good self-control, limited dysphoria/symptomatology, some risk factors present, and identifiable protective factors, including available and accessible social support.  PLAN OF CARE: Patient will be admitted to inpatient psychiatric unit for stabilization and safety. Will provide and encourage milieu participation. Provide medication management and maked adjustments as needed.  Will continue medication management to minimize risk of alcohol WDL. Will follow daily.    I certify that inpatient services furnished can reasonably be expected to improve the patient's condition.   Nehemiah MassedOBOS, Muaz Shorey, MD 11/15/2015, 10:56 AM

## 2015-11-16 DIAGNOSIS — F1094 Alcohol use, unspecified with alcohol-induced mood disorder: Secondary | ICD-10-CM

## 2015-11-16 LAB — TSH: TSH: 1.429 u[IU]/mL (ref 0.350–4.500)

## 2015-11-16 NOTE — Progress Notes (Signed)
[facility] MD Progress Note  11/16/2015 11:17 AM Scott Chandler  MRN:  [id number] Subjective:  Scott Chandler states " I am feeling better than yesterday, just a little groggy"  Objective:Scott Chandler is awake, alert and oriented X4. Seen attending group session.  Denies suicidal or homicidal ideation. Denies auditory or visual hallucination and does not appear to be responding to internal stimuli. Patient reports interacting well with staff and others. Patient reports attending AA meeting and has learned new coping skills to talking when he starts feeling down." Patient reports he agreed to start taken Cymbalta, denies side effects and states he is tolerating this well. Patient denies depression or depressive symptoms at this time. Patient reports good appetite other wise and resting well. Support, encouragement and reassurance was provided.    Principal Problem: Alcohol-induced mood disorder (HCC) Diagnosis:   Patient Active Problem List   Diagnosis Date Noted  . Alcohol dependence with withdrawal with complication (HCC) [F10.239] 11/14/2015  . Alcohol-induced mood disorder (HCC) [F10.94] 11/14/2015  . EPIGASTRIC PAIN [R10.13] 10/22/2009  . FECAL OCCULT BLOOD [R19.5] 10/22/2009  . HYPERLIPIDEMIA [E78.5] 10/21/2009  . ESOPHAGITIS, HX OF [Z87.19] 10/21/2009  . GERD [K21.9] 05/31/2008  . CHEST PAIN [R07.9] 05/31/2008  . DIARRHEA [R19.7] 05/31/2008   Total Time spent with patient: 30 minutes  Past Psychiatric History: See above  Past Medical History:  Past Medical History  Diagnosis Date  . GERD (gastroesophageal reflux disease)   . Chest pain     Past Surgical History  Procedure Laterality Date  . Transthoracic echocardiogram  05/18/2008    normal LVF/ mild LVH/trace mitral and tricuspid regurgitation   Family History:  Family History  Problem Relation Age of Onset  . Hypertension Father   . Cancer Mother    Family Psychiatric  History: See above Social History:  History  Alcohol Use  . Yes      History  Drug Use Not on file    Social History   Social History  . Marital Status: Married    Spouse Name: N/A  . Number of Children: N/A  . Years of Education: N/A   Social History Main Topics  . Smoking status: Former Games developer  . Smokeless tobacco: None  . Alcohol Use: Yes  . Drug Use: None  . Sexual Activity: Not Asked   Other Topics Concern  . None   Social History Narrative   Additional Social History:                         Sleep: Fair  Appetite:  Fair  Current Medications: Current Facility-Administered Medications  Medication Dose Route Frequency Provider Last Rate Last Dose  . acetaminophen (TYLENOL) tablet 650 mg  650 mg Oral Q6H PRN Scott Rings, NP   650 mg at 11/15/15 1713  . alum & mag hydroxide-simeth (MAALOX/MYLANTA) 200-200-20 MG/5ML suspension 30 mL  30 mL Oral Q4H PRN Scott Rings, NP      . DULoxetine (CYMBALTA) DR capsule 40 mg  40 mg Oral Daily Scott Cotta, MD   40 mg at 11/16/15 0815  . hydrOXYzine (ATARAX/VISTARIL) tablet 25 mg  25 mg Oral Q6H PRN Scott Rings, NP   25 mg at 11/15/15 2112  . loperamide (IMODIUM) capsule 2-4 mg  2-4 mg Oral PRN Scott Rings, NP   4 mg at 11/15/15 0843  . LORazepam (ATIVAN) tablet 1 mg  1 mg Oral Q6H PRN Scott Rings, NP   1  mg at 11/15/15 2112  . LORazepam (ATIVAN) tablet 1 mg  1 mg Oral BID Scott Rings, NP   1 mg at 11/16/15 0816   Followed by  . [START ON 11/17/2015] LORazepam (ATIVAN) tablet 1 mg  1 mg Oral Daily Scott Rings, NP      . magnesium hydroxide (MILK OF MAGNESIA) suspension 30 mL  30 mL Oral Daily PRN Scott Rings, NP      . multivitamin with minerals tablet 1 tablet  1 tablet Oral Daily Scott Rings, NP   1 tablet at 11/16/15 0815  . ondansetron (ZOFRAN-ODT) disintegrating tablet 4 mg  4 mg Oral Q6H PRN Scott Rings, NP      . propranolol (INDERAL) tablet 20 mg  20 mg Oral BID PRN Scott Rings, NP      . thiamine (VITAMIN B-1) tablet 100 mg  100 mg Oral  Daily Scott Rings, NP   100 mg at 11/16/15 0815  . traZODone (DESYREL) tablet 50 mg  50 mg Oral QHS PRN Scott Cotta, MD   50 mg at 11/15/15 2112    Lab Results:  Results for orders placed or performed during the hospital encounter of 11/14/15 (from the past 48 hour(s))  TSH     Status: None   Collection Time: 11/16/15  7:32 AM  Result Value Ref Range   TSH 1.429 0.350 - 4.500 uIU/mL    Comment: Performed at [facility]    Blood Alcohol level:  Lab Results  Component Value Date   [facility] 21* 11/14/2015    Metabolic Disorder Labs: No results found for: HGBA1C, MPG No results found for: PROLACTIN No results found for: CHOL, TRIG, HDL, CHOLHDL, VLDL, LDLCALC  Physical Findings: AIMS: Facial and Oral Movements Muscles of Facial Expression: None, normal Lips and Perioral Area: None, normal Jaw: None, normal Tongue: None, normal,Extremity Movements Upper (arms, wrists, hands, fingers): None, normal Lower (legs, knees, ankles, toes): None, normal, Trunk Movements Neck, shoulders, hips: None, normal, Overall Severity Severity of abnormal movements (highest score from questions above): None, normal Incapacitation due to abnormal movements: None, normal Patient's awareness of abnormal movements (rate only patient's report): No Awareness, Dental Status Current problems with teeth and/or dentures?: No Does patient usually wear dentures?: No  CIWA:  CIWA-Ar Total: 2 COWS:     Musculoskeletal: Strength & Muscle Tone: within normal limits Gait & Station: normal Patient leans: N/A  Psychiatric Specialty Exam: Physical Exam  Nursing note and vitals reviewed. Constitutional: He is oriented to person, place, and time. He appears well-developed.  HENT:  Head: Normocephalic.  Cardiovascular: Normal rate.   Musculoskeletal: Normal range of motion.  Neurological: He is alert and oriented to person, place, and time.  Psychiatric: He has a normal mood and affect.  His behavior is normal.    Review of Systems  Psychiatric/Behavioral: Positive for depression and substance abuse. Negative for suicidal ideas and hallucinations. The patient is nervous/anxious.   All other systems reviewed and are negative.   Blood pressure 135/87, pulse 98, temperature 97.5 F (36.4 C), temperature source Oral, resp. rate 20, height 5\' 11"  (1.803 m), weight 105.235 kg (232 lb), SpO2 96 %.Body mass index is 32.37 kg/(m^2).  General Appearance: Casual  Eye Contact:  Good  Speech:  Clear and Coherent  Volume:  Normal  Mood:  Anxious  Affect:  Congruent  Thought Process:  Coherent  Orientation:  Full (Time, Place, and Person)  Thought Content:  Hallucinations: None  Suicidal Thoughts:  No  Homicidal Thoughts:  No  Memory:  Immediate;   Fair Remote;   Fair  Judgement:  Fair  Insight:  Fair  Psychomotor Activity:  Normal  Concentration:  Concentration: Fair  Recall:  [employer] of Knowledge:  Fair  Language:  Good  Akathisia:  No  Handed:  Right  AIMS (if indicated):     Assets:  Desire for Improvement Social Support  ADL's:  Intact  Cognition:  WNL  Sleep:  Number of Hours: 4.5     I agree with current treatment plan on 11/16/2015, Patient seen face-to-face for psychiatric evaluation follow-up, chart reviewed. Reviewed the information documented and agree with the treatment plan.  Treatment Plan Summary: Daily contact with patient to assess and evaluate symptoms and progress in treatment and Medication management Continue Ativan Detox for alcohol WDL , Patient states that Zoloft does not work much for him , but does feel he would benefit from an antidepressant- we discussed alternatives and agrees to Cymbalta trial- as noted by MD Started on CWIA/ Ativan Protocol Will continue to monitor vitals ,medication compliance and treatment side effects while patient is here.  Reviewed labs Glucose 101 elevated ,BAL - 21, UDS -  CSW will start working on  disposition.  Patient to participate in therapeutic milieu   Scott Rack, NP 11/16/2015, 11:17 AM  Reviewed the information documented and agree with the treatment plan.  Maysen Sudol 11/17/2015 11:49 AM

## 2015-11-16 NOTE — Progress Notes (Signed)
DAR note. Pt presents pleasant and cooperative with care. Reports sleep did improve. Pt did not complete self inventory sheet. Encouraged patient to complete daily. Pt verbalized understanding. Denies SI, HI, AVH. Reports feeling better today. Pt noted smiling, laughing and talking with peers. Med and group compliant. No negative behaviors. Reports back pain. PRN given with good relief. Encouragement and support offered, pt receptive and remains safe on unit with q 15 min checks.

## 2015-11-16 NOTE — Progress Notes (Signed)
D    Pt interacts in the dayroom but did not want to go to group   He requested his medications at 9 p and asked for everything he could have    Pt lacks insight into his illness and laughs like it is a joke when talking about treatment and requests all the pill he can have of shots of etoh  Pt is pleasant on approach today and seems to be more serious about his treatment A   Verbal support given   Encouraged pt being vested in his treatment    Medications administered and effectiveness monitored   Q 15 min checks R   Pt is safe at present

## 2015-11-16 NOTE — [facility] Group Notes (Signed)
[facility] Group Notes:  (Clinical Social Work)   02/23/2015     10:00-11:00AM  Summary of Progress/Problems:   In today's process group, patients discussed their answers to the question "What is something you are doing that keeps you from living the life you want?"   A decisional balance exercise on the whiteboard was used to explore the perceived benefits and costs of the various actions listed, as well as potential benefits and costs of deciding to change.  The patient expressed that the unhealthy coping he often uses is drinking, and said he always desires to be slightly inebriated on some substance because he does not "like the world unless it's a little off kilter."  He stated that one way it keeps him from living the life he wants is that coming off alcohol is a very dark thing to do, causes him to be in a world of total darkness.  Type of Therapy:  Group Therapy - Process   Participation Level:  Active  Participation Quality:  Appropriate and Sharing  Affect:  Blunted  Cognitive:  Appropriate  Insight:  Improving  Engagement in Therapy:  Engaged  Modes of Intervention:  Education, Motivational Interviewing  Ambrose Mantle, LCSW 11/16/2015, 2:23 PM

## 2015-11-16 NOTE — Plan of Care (Signed)
Problem: Activity: Goal: Sleeping patterns will improve Outcome: Progressing Reports sleeping improved slightly

## 2015-11-17 NOTE — Plan of Care (Signed)
Problem: Education: Goal: Utilization of techniques to improve thought processes will improve Outcome: Progressing Nurse discussed depression/coping skills with patient.    

## 2015-11-17 NOTE — Progress Notes (Signed)
Patient did attend the evening speaker AA meeting.  

## 2015-11-17 NOTE — Progress Notes (Signed)
[facility] MD Progress Note  11/17/2015 1:09 PM Scott Chandler  MRN:  [id number] Subjective:  Scott Chandler states " I am doing fine, it reassuring for me to be around other that are going through life too."  Objective:Scott Chandler is awake, alert and oriented X4. Seen attending group session.  Denies suicidal or homicidal ideation. Denies auditory or visual hallucination and does not appear to be responding to internal stimuli. Patient reports he is ready to get back to work. States he is going to try and stay sober. Patient reports that his wife is supportive. Patient reports attending AA meeting and is hopeful to find a meeting closer to his house. Patient report his wife and family is supportive. Patient denies depression or depressive symptoms at this time. Patient reports good appetite other wise and resting well. Support, encouragement and reassurance was provided.    Principal Problem: Alcohol-induced mood disorder (HCC) Diagnosis:   Patient Active Problem List   Diagnosis Date Noted  . Alcohol dependence with withdrawal with complication (HCC) [F10.239] 11/14/2015  . Alcohol-induced mood disorder (HCC) [F10.94] 11/14/2015  . EPIGASTRIC PAIN [R10.13] 10/22/2009  . FECAL OCCULT BLOOD [R19.5] 10/22/2009  . HYPERLIPIDEMIA [E78.5] 10/21/2009  . ESOPHAGITIS, HX OF [Z87.19] 10/21/2009  . GERD [K21.9] 05/31/2008  . CHEST PAIN [R07.9] 05/31/2008  . DIARRHEA [R19.7] 05/31/2008   Total Time spent with patient: 30 minutes  Past Psychiatric History: See above  Past Medical History:  Past Medical History:  Diagnosis Date  . Chest pain   . GERD (gastroesophageal reflux disease)     Past Surgical History:  Procedure Laterality Date  . TRANSTHORACIC ECHOCARDIOGRAM  05/18/2008   normal LVF/ mild LVH/trace mitral and tricuspid regurgitation   Family History:  Family History  Problem Relation Age of Onset  . Hypertension Father   . Cancer Mother    Family Psychiatric  History: See above Social History:   History  Alcohol Use  . Yes     History  Drug use: Unknown    Social History   Social History  . Marital status: Married    Spouse name: N/A  . Number of children: N/A  . Years of education: N/A   Social History Main Topics  . Smoking status: Former Games developer  . Smokeless tobacco: None  . Alcohol use Yes  . Drug use: Unknown  . Sexual activity: Not Asked   Other Topics Concern  . None   Social History Narrative  . None   Additional Social History:                         Sleep: Fair  Appetite:  Fair  Current Medications: Current Facility-Administered Medications  Medication Dose Route Frequency Provider Last Rate Last Dose  . acetaminophen (TYLENOL) tablet 650 mg  650 mg Oral Q6H PRN Charm Rings, NP   650 mg at 11/17/15 1118  . alum & mag hydroxide-simeth (MAALOX/MYLANTA) 200-200-20 MG/5ML suspension 30 mL  30 mL Oral Q4H PRN Charm Rings, NP      . DULoxetine (CYMBALTA) DR capsule 40 mg  40 mg Oral Daily Craige Cotta, MD   40 mg at 11/17/15 0851  . hydrOXYzine (ATARAX/VISTARIL) tablet 25 mg  25 mg Oral Q6H PRN Charm Rings, NP   25 mg at 11/16/15 2145  . loperamide (IMODIUM) capsule 2-4 mg  2-4 mg Oral PRN Charm Rings, NP   4 mg at 11/15/15 0843  . LORazepam (ATIVAN) tablet 1  mg  1 mg Oral Q6H PRN Charm Rings, NP   1 mg at 11/17/15 1213  . magnesium hydroxide (MILK OF MAGNESIA) suspension 30 mL  30 mL Oral Daily PRN Charm Rings, NP      . multivitamin with minerals tablet 1 tablet  1 tablet Oral Daily Charm Rings, NP   1 tablet at 11/17/15 [phone]  . ondansetron (ZOFRAN-ODT) disintegrating tablet 4 mg  4 mg Oral Q6H PRN Charm Rings, NP      . propranolol (INDERAL) tablet 20 mg  20 mg Oral BID PRN Charm Rings, NP      . thiamine (VITAMIN B-1) tablet 100 mg  100 mg Oral Daily Charm Rings, NP   100 mg at 11/17/15 9528  . traZODone (DESYREL) tablet 50 mg  50 mg Oral QHS PRN Craige Cotta, MD   50 mg at 11/16/15 2145    Lab  Results:  Results for orders placed or performed during the hospital encounter of 11/14/15 (from the past 48 hour(s))  TSH     Status: None   Collection Time: 11/16/15  7:32 AM  Result Value Ref Range   TSH 1.429 0.350 - 4.500 uIU/mL    Comment: Performed at [facility]    Blood Alcohol level:  Lab Results  Component Value Date   ETH 21 (H) 11/14/2015    Metabolic Disorder Labs: No results found for: HGBA1C, MPG No results found for: PROLACTIN No results found for: CHOL, TRIG, HDL, CHOLHDL, VLDL, LDLCALC  Physical Findings: AIMS: Facial and Oral Movements Muscles of Facial Expression: None, normal Lips and Perioral Area: None, normal Jaw: None, normal Tongue: None, normal,Extremity Movements Upper (arms, wrists, hands, fingers): None, normal Lower (legs, knees, ankles, toes): None, normal, Trunk Movements Neck, shoulders, hips: None, normal, Overall Severity Severity of abnormal movements (highest score from questions above): None, normal Incapacitation due to abnormal movements: None, normal Patient's awareness of abnormal movements (rate only patient's report): No Awareness, Dental Status Current problems with teeth and/or dentures?: No Does patient usually wear dentures?: No  CIWA:  CIWA-Ar Total: 0 COWS:     Musculoskeletal: Strength & Muscle Tone: within normal limits Gait & Station: normal Patient leans: N/A  Psychiatric Specialty Exam: Physical Exam  Nursing note and vitals reviewed. Constitutional: He is oriented to person, place, and time. He appears well-developed.  HENT:  Head: Normocephalic.  Cardiovascular: Normal rate.   Musculoskeletal: Normal range of motion.  Neurological: He is alert and oriented to person, place, and time.  Psychiatric: He has a normal mood and affect. His behavior is normal.    Review of Systems  Psychiatric/Behavioral: Positive for depression and substance abuse. Negative for hallucinations and suicidal  ideas. The patient is nervous/anxious.   All other systems reviewed and are negative.   Blood pressure 116/64, pulse 70, temperature 97.9 F (36.6 C), temperature source Oral, resp. rate 16, height  (1.803 m), weight 105.2 kg (232 lb), SpO2 96 %.Body mass index is 32.36 kg/m.  General Appearance: Casual and Well Groomed  Eye Contact:  Good  Speech:  Clear and Coherent  Volume:  Normal  Mood:  Anxious  Affect:  Congruent  Thought Process:  Coherent  Orientation:  Full (Time, Place, and Person)  Thought Content:  Hallucinations: None  Suicidal Thoughts:  No  Homicidal Thoughts:  No  Memory:  Immediate;   Fair Remote;   Fair  Judgement:  Fair  Insight:  Fair  Psychomotor Activity:  Normal  Concentration:  Concentration: Fair  Recall:  [employer] of Knowledge:  Fair  Language:  Good  Akathisia:  No  Handed:  Right  AIMS (if indicated):     Assets:  Desire for Improvement Social Support  ADL's:  Intact  Cognition:  WNL  Sleep:  Number of Hours: 4.5     I agree with current treatment plan on 11/17/2015, Patient seen face-to-face for psychiatric evaluation follow-up, chart reviewed. Reviewed the information documented and agree with the treatment plan.  Treatment Plan Summary: Daily contact with patient to assess and evaluate symptoms and progress in treatment and Medication management Continue AtivanDetox for alcohol WDL , Patient states that Zoloft does not work much for him , but does feel he would benefit from an antidepressant- we discussed alternatives and agrees to  Continue Cymbalta 40 mg PO QD trial- as noted by MD Started on CWIA/ Ativan Protocol Will continue to monitor vitals ,medication compliance and treatment side effects while patient is here.  Reviewed labs Glucose 101 elevated ,BAL - 21, UDS -  CSW will start working on disposition.  Patient to participate in therapeutic milieu   Oneta Rack, NP 11/17/2015, 1:09 PM   Reviewed the  information documented and agree with the treatment plan.  Sanoe Hazan 11/17/2015 3:15 PM

## 2015-11-17 NOTE — [facility] Group Notes (Signed)

## 2015-11-17 NOTE — Progress Notes (Addendum)
D:  Patient denied SI and HI, contracts for safety.  Denied A/V hallucinations.  A:  Medications administered per MD orders.  Emotional support and encouragement given patient. R:  Safety maintained with 15 minute checks.  Patient's self inventory sheet, patient sleeps good, sleep medication is helpful.  Good appetite, low energy level, poor concentration.  Rated depression 4, hopeless 2, anxiety 5.  Withdrawals, diarrhea, cramping, irritability.  SI sometimes, but denied SI while talking to nurse this morning, contracts for safety.  Pain, lower back, worst pain in past 24 hours is #5, pain medication is helpful.  Plans to relax, focus on future.  No discharge plans.  Patient was given printout for cymbalta per his request.

## 2015-11-17 NOTE — [facility] Group Notes (Signed)
[facility] Group Notes: (Clinical Social Work)   11/17/2015      Type of Therapy:  Group Therapy   Participation Level:  Did Not Attend despite MHT prompting   Ambrose Mantle, LCSW 11/17/2015, 11:56 AM

## 2015-11-18 DIAGNOSIS — F1020 Alcohol dependence, uncomplicated: Secondary | ICD-10-CM | POA: Diagnosis present

## 2015-11-18 DIAGNOSIS — F1024 Alcohol dependence with alcohol-induced mood disorder: Principal | ICD-10-CM

## 2015-11-18 DIAGNOSIS — F10229 Alcohol dependence with intoxication, unspecified: Secondary | ICD-10-CM

## 2015-11-18 DIAGNOSIS — F3289 Other specified depressive episodes: Secondary | ICD-10-CM

## 2015-11-18 MED ORDER — TRAZODONE HCL 50 MG PO TABS: 50.0000 mg | ORAL_TABLET | Freq: Every evening | ORAL | 0 refills | Status: DC | PRN

## 2015-11-18 MED ORDER — PROPRANOLOL HCL 20 MG PO TABS
20.0000 mg | ORAL_TABLET | Freq: Two times a day (BID) | ORAL | 0 refills | Status: DC | PRN
Start: 2015-11-18 — End: 2015-12-30

## 2015-11-18 MED ORDER — DULOXETINE HCL 40 MG PO CPEP: 40.0000 mg | ORAL_CAPSULE | Freq: Every day | ORAL | 0 refills | Status: DC

## 2015-11-18 NOTE — Tx Team (Signed)
Interdisciplinary Treatment Plan Update (Adult)  Date:  11/18/2015  Time Reviewed:  12:10 PM   Progress in Treatment: Attending groups: Yes Participating in groups:  YEs Taking medication as prescribed:  Yes. Tolerating medication:  Yes. Family/Significant othe contact made:  SPE completed with pt; pt declined to consent to family contact.  Patient understands diagnosis:  Yes. and As evidenced by:  seeking treatment for ETOH abuse, depression, SI with plan, and for medication stabilization.  Discussing patient identified problems/goals with staff:  Yes. Medical problems stabilized or resolved:  Yes. Denies suicidal/homicidal ideation: Passive SI/able to contract for safety on the unit.  Issues/concerns per patient self-inventory:  Other:  Discharge Plan or Barriers: Pt plans to return home; follow-up with [facility]. Due to EPIC problems, they are unable to schedule appt prior to his discharge. CSW will contact pt directly with appts for med management and counseling  Reason for Continuation of Hospitalization: None  Comments:  Scott Chandler is an 40 y.o. male, Caucasian, Married who presents to [facility] as walk in for depression and SI. Patient states that his primary concern is of increasing depression with ETOH and binge drinking with current complaint of severe abdominal cramping and hx. Of lower back problems. Patient denies history of psychotic symptoms. Patient wife Scott Chandler present during assessment. Patient states that he last drank alcohol this morning with unknown amount. Per wife, patient has SI with psychological problems. Patient states that he has had recent decline in sleep with 2 hours per night in past month. Historically, pt. States that he has a daughter who is 36 and she was sent to a group home, along with separation from wife in past month. Patient states that he has started isolating self and drinking heavily within past month. Patient currently stays by  self.Patient denies current [during assessment] SI, but acknowledges SI with plan to hang self in closet with belt. Patient denies history of SI or HI. Patient deneis history or current AVH, but acknowledges VH after drinking binge. Patient denies history of inpatient or outpatient psychiatric care, but states that he does see primary care provider at [facility] and was prescribed Zoloft.  With alcohol starting at or around age 49 consuming unknown amounts with known average of 1/5 liquor/ vodka per day for years. Longest period of sobriety was x 1 day months ago from present date.  Diagnosis: Unspecified Depressive Disorder, Alcohol Use Disorder, Severe  Estimated length of stay:  Discharge today   New goal(s): to develop effective aftercare plan.   Additional Comments:  Patient and CSW reviewed pt's identified goals and treatment plan. Patient verbalized understanding and agreed to treatment plan. CSW reviewed [facility] "Discharge Process and Patient Involvement" Form. Pt verbalized understanding of information provided and signed form.    Review of initial/current patient goals per problem list:  1. Goal(s): Patient will participate in aftercare plan  Met: Yes  Target date: at discharge  As evidenced by: Patient will participate within aftercare plan AEB aftercare provider and housing plan at discharge being identified.  7/21: CSW assessing for appropriate referrals.   7/24: : Pt plans to return home; follow-up with [facility]. Due to EPIC problems, they are unable to schedule appt prior to his discharge. CSW will contact pt directly with appts for med management and counseling  2. Goal (s): Patient will exhibit decreased depressive symptoms and suicidal ideations.  Met: Yes   Target date: at discharge  As evidenced by: Patient will utilize self rating  of depression at 3 or below and demonstrate decreased signs of depression or be deemed stable for  discharge by MD.  7/21: Pt rates depression as high. Passive SI/Able to contract for safety on the unit. No HI/AVH reported by pt.   7/24: Pt rates depression as 0/10 and presents with pleasant mood/calm affect. Denies SI/HI/AVH.   3. Goal(s): Patient will demonstrate decreased signs of withdrawal due to substance abuse  Met: Yes.   Target date:at discharge   As evidenced by: Patient will produce a CIWA/COWS score of 0, have stable vitals signs, and no symptoms of withdrawal.  7/21: Pt reports moderate withdrawals with no CIWA score taken; high standing BP. All other vitals are stable.   7/24: Pt reports no signs of withdrawal with No CIWA score and stable vitals. Per MD, pt is medically stable for d/c today.   Attendees: Patient:   11/18/2015 12:10 PM   Family:   11/18/2015 12:10 PM   Physician:  Dr. Parke Poisson MD  11/18/2015 12:10 PM   Nursing:   Merlene Morse RN 11/18/2015 12:10 PM   Clinical Social Worker: Maxie Better, LCSW 11/18/2015 12:10 PM   Clinical Social Worker:Lauren Madie Reno 11/18/2015 12:10 PM   Other:  Gerline Legacy Nurse Case Manager 11/18/2015 12:10 PM   Other:  Agustina Caroli NP; Ricky Ala NP 11/18/2015 12:10 PM   Other:   11/18/2015 12:10 PM   Other:  11/18/2015 12:10 PM   Other:  11/18/2015 12:10 PM   Other:  11/18/2015 12:10 PM    11/18/2015 12:10 PM    11/18/2015 12:10 PM    11/18/2015 12:10 PM    11/18/2015 12:10 PM    Scribe for Treatment Team:   Maxie Better, LCSW 11/18/2015 12:10 PM

## 2015-11-18 NOTE — Progress Notes (Signed)
Discharge Note:  Patient discharged home.  Patient denied SI and HI.  Denied A/V hallucinations.  Suicide prevention information given and discussed with patient who stated he understood and had no questions.  Patient stated he received all his belongings, clothing, toiletries, misc items, prescriptions.  Patient stated he appreciated all assistance received from [facility] staff.  All required discharge information given to patient at discharge.

## 2015-11-18 NOTE — BHH Group Notes (Signed)
Pt attended spiritual care group on grief and loss facilitated by chaplain Burnis Kingfisher   Group opened with brief discussion and psycho-social ed around grief and loss.  Group identifying life patterns, circumstances, changes connected to loss in relationship and in relation to self. Established group norm of speaking from own life experience. Group goal of establishing open and affirming space for members to share loss and experience with grief, normalize grief experience and provide psycho social education and grief support. Group facilitation draws on narrative, Adlerian, and brief CBT  Scott Chandler was present throughout group.  Was pulled from group for conference with MD, returned afterward.  Alert and Oriented x4.  Ferrel showed engagement with other group members through head nods and affirming gestures.  Did not voluntarily engage verbally.  Facilitator checked in with [place] at end of group session and he stated he recognized a lot of the grief themes that others had been sharing.     Belva Crome MDiv

## 2015-11-18 NOTE — Progress Notes (Addendum)
D: Pt endorsed moderate anxiety; states, "I became more anxious after attending the AA meeting." Pt however denied depression, SI, HI, AVH or pain; states, "I think the new antidepressant they started me on is working." Pt remained calm and cooperative. A: Medications offered as prescribed.  Support, encouragement, and safe environment provided.  15-minute safety checks continue.  R: Pt was med compliant.  Pt attended AA group meeting. Safety checks continue.

## 2015-11-18 NOTE — Progress Notes (Signed)
D:  Patient's self inventory sheet, patient has fair sleep, sleep medication given and was helpful.  Good appetite, normal energy level, poor concentration.  Rated depression and anxiety 2, hopeless 1.  Withdrawals, cravings, cramping.  Denied SI.  Denied physical problems.  Physical pain, back pain chronic, pain medication is helpful.  Goal is not to drink.  No discharge plans. A:  Medications administered per MD orders.  Emotional support and encouragement given patient. R:  Denied SI and HI, contracts for safety.  Denied A/V hallucinations.  Safety maintained with 15 minute checks.

## 2015-11-18 NOTE — Progress Notes (Signed)
  [facility] Adult Case Management Discharge Plan :  Will you be returning to the same living situation after discharge:  Yes,  home with wife At discharge, do you have transportation home?: Yes,  wife Do you have the ability to pay for your medications: Yes,  Private insurance  Release of information consent forms completed and submitted to medical records by CSW. Patient to Follow up at: Follow-up Information    [facility].   Why:  Dr. Tenny Craw is reviewing you chart and will call social worker back with your appt. if this information is not provided by your discharge, social will call you directly with appt information. Thank you.  Contact information: [address] [place], [place] [address] Phone: [phone] Fax: [phone]       [facility].   Why:  Dr. Tenny Craw is reviewing you chart and will call social worker back with your appt. if this information is not provided by your discharge, social will call you directly with appt information. Thank you.  Contact information: [address] [place], [place] [address] Phone: [phone] Fax: [phone]          Next level of care provider has access to [facility] Link:no  Safety Planning and Suicide Prevention discussed: Yes,  SPE completed with pt; pt declined to consent to family contact.   Have you used any form of tobacco in the last 30 days? (Cigarettes, Smokeless Tobacco, Cigars, and/or Pipes): No  Has patient been referred to the Quitline?: Patient refused referral  Patient has been referred for addiction treatment: Yes  Smart, Braxten Memmer LCSW 11/18/2015, 12:09 PM

## 2015-11-18 NOTE — BHH Suicide Risk Assessment (Signed)
[facility] Discharge Suicide Risk Assessment   Principal Problem: Alcohol-induced depressive disorder with moderate or severe use disorder with onset during intoxication [facility]) Discharge Diagnoses:  Patient Active Problem List   Diagnosis Date Noted  . Alcohol use disorder, severe, dependence (HCC) [F10.20] 11/18/2015  . Alcohol-induced depressive disorder with moderate or severe use disorder with onset during intoxication (HCC) [F10.24, F10.229, F32.89] 11/14/2015  . EPIGASTRIC PAIN [R10.13] 10/22/2009  . FECAL OCCULT BLOOD [R19.5] 10/22/2009  . HYPERLIPIDEMIA [E78.5] 10/21/2009  . ESOPHAGITIS, HX OF [Z87.19] 10/21/2009  . GERD [K21.9] 05/31/2008  . CHEST PAIN [R07.9] 05/31/2008  . DIARRHEA [R19.7] 05/31/2008    Total Time spent with patient: 30 minutes  Musculoskeletal: Strength & Muscle Tone: within normal limits Gait & Station: normal Patient leans: N/A  Psychiatric Specialty Exam: Review of Systems  Psychiatric/Behavioral: Positive for substance abuse. Negative for depression and suicidal ideas.  All other systems reviewed and are negative.   Blood pressure 113/80, pulse 73, temperature 98.4 F (36.9 C), resp. rate 16, height 5\' 11"  (1.803 m), weight 105.2 kg (232 lb), SpO2 96 %.Body mass index is 32.36 kg/m.  General Appearance: Casual  Eye Contact::  Fair  Speech:  Clear and Coherent409  Volume:  Normal  Mood:  Euthymic  Affect:  Congruent  Thought Process:  Goal Directed and Descriptions of Associations: Intact  Orientation:  Full (Time, Place, and Person)  Thought Content:  Logical  Suicidal Thoughts:  No  Homicidal Thoughts:  No  Memory:  Immediate;   Fair Recent;   Fair Remote;   Fair  Judgement:  Fair  Insight:  Fair  Psychomotor Activity:  Normal  Concentration:  Fair  Recall:  [employer] of Knowledge:Fair  Language: Fair  Akathisia:  No  Handed:  Right  AIMS (if indicated):     Assets:  Desire for Improvement  Sleep:  Number of Hours: 4  Cognition: WNL   ADL's:  Intact   Mental Status Per Nursing Assessment::   On Admission:  Suicidal ideation indicated by patient, Suicide plan, Plan includes specific time, place, or method, Self-harm thoughts, Self-harm behaviors  Demographic Factors:  Male and Caucasian  Loss Factors: NA  Historical Factors: Impulsivity  Risk Reduction Factors:   Positive therapeutic relationship  Continued Clinical Symptoms:  Alcohol/Substance Abuse/Dependencies  Cognitive Features That Contribute To Risk:  None    Suicide Risk:  Minimal: No identifiable suicidal ideation.  Patients presenting with no risk factors but with morbid ruminations; may be classified as minimal risk based on the severity of the depressive symptoms  Follow-up Information    [facility].   Why:  WAITING FOR APPT TIME/DATE. PLEASE DO NOT PRINT AVS YET.  Contact information: [address] [place], [place] [address] Phone: [phone] Fax: [phone]       [facility].   Contact information: [address] [place], [place] [address] Phone: [phone] Fax: [phone]          Plan Of Care/Follow-up recommendations:  Activity:  no restrictions Diet:  regular Tests:  as needed Other:  follow up with aftercare  Chelsy Parrales, MD 11/18/2015, 10:52 AM

## 2015-11-18 NOTE — Progress Notes (Signed)
Recreation Therapy Notes  Date: 11/18/15 Time: 0930 Location: [address] Group Room  Group Topic: Stress Management  Goal Area(s) Addresses:  Patient will verbalize importance of using healthy stress management.  Patient will identify positive emotions associated with healthy stress management.   Behavioral Response: Engaged  Intervention: Stress Management  Activity :  Guided Imagery.  LRT introduced the stress management technique of guided imagery to the group.  A script was used to deliver the technique to the patients.  Patients were asked to follow the script read allowed by the LRT to engage in the technique.  Education:  Stress Management, Discharge Planning.   Education Outcome: Acknowledges edcuation/In group clarification offered/Needs additional education  Clinical Observations/Feedback: Pt was engaged and stated that he felt good and understood the purpose of the activity.  Doneen Poisson, LRT/CTRS        Lillia Abed, Nellie Pester A 11/18/2015 1:36 PM

## 2015-11-18 NOTE — Discharge Summary (Signed)
Physician Discharge Summary Note  Patient:  Scott Chandler is an 40 y.o., male MRN:  [id number] DOB:  June 12, 1975 Patient phone:  [phone] (home)  Patient address:   [address] [place] [address],  Total Time spent with patient: Greater than 30 minutes  Date of Admission:  11/14/2015 Date of Discharge: 11-18-15  Reason for Admission: Alcohol intoxication  Principal Problem: Alcohol-induced depressive disorder with moderate or severe use disorder with onset during intoxication [facility]) Discharge Diagnoses: Patient Active Problem List   Diagnosis Date Noted  . Alcohol use disorder, severe, dependence (HCC) [F10.20] 11/18/2015  . Alcohol-induced depressive disorder with moderate or severe use disorder with onset during intoxication (HCC) [F10.24, F10.229, F32.89] 11/14/2015  . EPIGASTRIC PAIN [R10.13] 10/22/2009  . FECAL OCCULT BLOOD [R19.5] 10/22/2009  . HYPERLIPIDEMIA [E78.5] 10/21/2009  . ESOPHAGITIS, HX OF [Z87.19] 10/21/2009  . GERD [K21.9] 05/31/2008  . CHEST PAIN [R07.9] 05/31/2008  . DIARRHEA [R19.7] 05/31/2008   Past Psychiatric History:   Past Medical History:  Past Medical History:  Diagnosis Date  . Chest pain   . GERD (gastroesophageal reflux disease)     Past Surgical History:  Procedure Laterality Date  . TRANSTHORACIC ECHOCARDIOGRAM  05/18/2008   normal LVF/ mild LVH/trace mitral and tricuspid regurgitation   Family History:  Family History  Problem Relation Age of Onset  . Hypertension Father   . Cancer Mother    Family Psychiatric  History: See H&P  Social History:  History  Alcohol Use  . Yes     History  Drug use: Unknown    Social History   Social History  . Marital status: Married    Spouse name: N/A  . Number of children: N/A  . Years of education: N/A   Social History Main Topics  . Smoking status: Former Games developer  . Smokeless tobacco: None  . Alcohol use Yes  . Drug use: Unknown  . Sexual activity: Not Asked   Other  Topics Concern  . None   Social History Narrative  . None   Hospital Course: 40 year old man. History of alcohol dependence. He states that he has recently been drinking more over the last few weeks. Reports multiple stressors, including 58 year old daughter being in a group home, and using cocaine, wife having history of MS, states " I actually have to go to work in order to relax ". States he has been drinking more than a case of beer per day.States " I run out of the beer at night, and I wake up in withdrawals". States that in the context of these stressors and heavy alcohol use he has been feeling progressively more depressed, and was feeling suicidal , with thoughts of hanging himself . States he came to the hospital of his own accord, because " I knew I needed help"  Reports he last drank yesterday in AM. Admission BAL 21.  Scott Chandler was admitted to the hospital with a BAL of 21 per toxicology tests results. He admits having been drinking a lot & it has worsened. He cited familial stressors as the reason for his alcohol abuse.  He was in this hospital for alcohol detox & mood stabilization treatments. He stated that he has been progressively getting very depressed. Scott Chandler's detoxification treatment was achieved using Ativan detox regimen on a tapering dose format. Ativan regimen is a short acting benzodiazepine with short half-life. It does not stay long in the system. By using Ativan detox regimen, he received a cleaner  detoxification treatment without any lingering adverse effects in his system. He was enrolled in the group counseling sessions, AA/NA meetings being offered and held on this unit. He participated and learned coping skills. He tolerated his treatment regimen without any significant adverse effects and or reactions reported.  Besides the detoxification treatments, Scott Chandler was also medicated & discharged on; Trazodone 50 mg for insomnia, Duloxetine 40 mg for depression & Propranolol 20 mg for  anxiety. He received no other medication regimen as he presented no other significant health issues. He tolerated his treatment regimen without any adverse effects or reactions.   Scott Chandler has completed detox treatment and his mood is stable. This is evidenced by his reports of improved mood & absence of substance withdrawal symptoms. He is encouraged to join/attend AA/NA meetings being offered and held within his community to achieve & maintain maximum sobriety.   Upon discharge, Scott Chandler adamantly denies any suicidal, homicidal ideations, auditory, visual hallucinations, delusional thoughts, paranoia & or substance withdrawal symptoms. He left [facility] with all personal belongings in no apparent distress. Transportation per wife.  Physical Findings: AIMS: Facial and Oral Movements Muscles of Facial Expression: None, normal Lips and Perioral Area: None, normal Jaw: None, normal Tongue: None, normal,Extremity Movements Upper (arms, wrists, hands, fingers): None, normal Lower (legs, knees, ankles, toes): None, normal, Trunk Movements Neck, shoulders, hips: None, normal, Overall Severity Severity of abnormal movements (highest score from questions above): None, normal Incapacitation due to abnormal movements: None, normal Patient's awareness of abnormal movements (rate only patient's report): No Awareness, Dental Status Current problems with teeth and/or dentures?: No Does patient usually wear dentures?: No  CIWA:  CIWA-Ar Total: 1 COWS:  COWS Total Score: 1  Musculoskeletal: Strength & Muscle Tone: within normal limits Gait & Station: normal Patient leans: N/A  Psychiatric Specialty Exam: Physical Exam  Constitutional: He appears well-developed.  HENT:  Head: Normocephalic.  Eyes: Pupils are equal, round, and reactive to light.  Neck: Normal range of motion.  Cardiovascular: Normal rate.   Respiratory: Effort normal.  GI: Soft.  Genitourinary:  Genitourinary Comments: Denies any issues in  this area  Musculoskeletal: Normal range of motion.  Neurological: He is alert.  Skin: Skin is warm and dry.    Review of Systems  Constitutional: Negative.   HENT: Negative.   Eyes: Negative.   Respiratory: Negative.   Cardiovascular: Negative.   Gastrointestinal: Negative.   Genitourinary: Negative.   Musculoskeletal: Negative.   Skin: Negative.   Psychiatric/Behavioral: Positive for depression (Stable). Negative for hallucinations, memory loss and suicidal ideas. The patient has insomnia (Stable). The patient is not nervous/anxious.     Blood pressure 113/80, pulse 73, temperature 98.4 F (36.9 C), resp. rate 16, height 5\' 11"  (1.803 m), weight 105.2 kg (232 lb), SpO2 96 %.Body mass index is 32.36 kg/m.  See Md's SRA  Have you used any form of tobacco in the last 30 days? (Cigarettes, Smokeless Tobacco, Cigars, and/or Pipes): No  Has this patient used any form of tobacco in the last 30 days? (Cigarettes, Smokeless Tobacco, Cigars, and/or Pipes): No  Blood Alcohol level:  Lab Results  Component Value Date   ETH 21 (H) 11/14/2015   Metabolic Disorder Labs:  No results found for: HGBA1C, MPG No results found for: PROLACTIN No results found for: CHOL, TRIG, HDL, CHOLHDL, VLDL, LDLCALC  See Psychiatric Specialty Exam and Suicide Risk Assessment completed by Attending Physician prior to discharge.  Discharge destination:  Home  Is patient on multiple antipsychotic therapies at discharge:  No   Has Patient had three or more failed trials of antipsychotic monotherapy by history:  No  Recommended Plan for Multiple Antipsychotic Therapies: NA    Medication List    STOP taking these medications   Fish Oil 1000 MG Caps   ibuprofen 200 MG tablet Commonly known as:  ADVIL,MOTRIN   naltrexone 50 MG tablet Commonly known as:  DEPADE   sertraline 100 MG tablet Commonly known as:  ZOLOFT     TAKE these medications     Indication  DULoxetine HCl 40 MG Cpep Take 40 mg by  mouth daily. For depression  Indication:  Major Depressive Disorder   propranolol 20 MG tablet Commonly known as:  INDERAL Take 1 tablet (20 mg total) by mouth 2 (two) times daily as needed (for anxiety).  Indication:  Anxiety   traZODone 50 MG tablet Commonly known as:  DESYREL Take 1 tablet (50 mg total) by mouth at bedtime as needed for sleep.  Indication:  Trouble Sleeping      Follow-up Information    [facility].   Why:  WAITING FOR APPT TIME/DATE. PLEASE DO NOT PRINT AVS YET.  Contact information: [address] [place], [place] [address] Phone: [phone] Fax: [phone]       [facility].   Contact information: [address] [place], [place] [address] Phone: [phone] Fax: [phone]         Follow-up recommendations:  Activity:  As tolerated Diet: As recommended by your primary care doctor. Keep all scheduled follow-up appointments as recommended.  Comments: Patient is instructed prior to discharge to: Take all medications as prescribed by his/her mental healthcare provider. Report any adverse effects and or reactions from the medicines to his/her outpatient provider promptly. Patient has been instructed & cautioned: To not engage in alcohol and or illegal drug use while on prescription medicines. In the event of worsening symptoms, patient is instructed to call the crisis hotline, 911 and or go to the nearest ED for appropriate evaluation and treatment of symptoms. To follow-up with his/her primary care provider for your other medical issues, concerns and or health care needs.   Signed: Sanjuana Kava, NP, PMNHP, FNP-BC 11/18/2015, 11:11 AM

## 2015-12-26 ENCOUNTER — Encounter (HOSPITAL_COMMUNITY): Payer: Self-pay | Admitting: *Deleted

## 2015-12-26 ENCOUNTER — Encounter (HOSPITAL_COMMUNITY): Payer: Self-pay

## 2015-12-26 ENCOUNTER — Emergency Department (HOSPITAL_COMMUNITY)
Admission: EM | Admit: 2015-12-26 | Discharge: 2015-12-26 | Disposition: A | Discharge status: Other Acute Inpatient Hospital | Payer: BLUE CROSS/BLUE SHIELD | Attending: Emergency Medicine | Admitting: Emergency Medicine

## 2015-12-26 ENCOUNTER — Ambulatory Visit (HOSPITAL_COMMUNITY)
Admission: EM | Admit: 2015-12-26 | Discharge: 2015-12-26 | Disposition: A | Discharge status: Home / Self Care | Payer: No Typology Code available for payment source | Source: Intra-hospital | Attending: Psychiatry | Admitting: Psychiatry

## 2015-12-26 ENCOUNTER — Inpatient Hospital Stay (HOSPITAL_COMMUNITY)
Admission: EM | Admit: 2015-12-26 | Discharge: 2015-12-30 | DRG: 897 | Disposition: A | Discharge status: Home / Self Care | Payer: No Typology Code available for payment source | Source: Intra-hospital | Attending: Psychiatry | Admitting: Psychiatry

## 2015-12-26 DIAGNOSIS — F39 Unspecified mood [affective] disorder: Secondary | ICD-10-CM

## 2015-12-26 DIAGNOSIS — F10229 Alcohol dependence with intoxication, unspecified: Secondary | ICD-10-CM | POA: Diagnosis present

## 2015-12-26 DIAGNOSIS — F32A Depression, unspecified: Secondary | ICD-10-CM

## 2015-12-26 DIAGNOSIS — Y906 Blood alcohol level of 120-199 mg/100 ml: Secondary | ICD-10-CM | POA: Diagnosis present

## 2015-12-26 DIAGNOSIS — G4700 Insomnia, unspecified: Secondary | ICD-10-CM | POA: Diagnosis present

## 2015-12-26 DIAGNOSIS — F1010 Alcohol abuse, uncomplicated: Secondary | ICD-10-CM

## 2015-12-26 DIAGNOSIS — Z8249 Family history of ischemic heart disease and other diseases of the circulatory system: Secondary | ICD-10-CM

## 2015-12-26 DIAGNOSIS — Z79899 Other long term (current) drug therapy: Secondary | ICD-10-CM

## 2015-12-26 DIAGNOSIS — Z87891 Personal history of nicotine dependence: Secondary | ICD-10-CM | POA: Insufficient documentation

## 2015-12-26 DIAGNOSIS — R45851 Suicidal ideations: Secondary | ICD-10-CM | POA: Diagnosis present

## 2015-12-26 DIAGNOSIS — F329 Major depressive disorder, single episode, unspecified: Secondary | ICD-10-CM | POA: Diagnosis not present

## 2015-12-26 DIAGNOSIS — F1014 Alcohol abuse with alcohol-induced mood disorder: Secondary | ICD-10-CM | POA: Diagnosis present

## 2015-12-26 DIAGNOSIS — F1020 Alcohol dependence, uncomplicated: Secondary | ICD-10-CM | POA: Diagnosis present

## 2015-12-26 DIAGNOSIS — F1024 Alcohol dependence with alcohol-induced mood disorder: Secondary | ICD-10-CM | POA: Diagnosis present

## 2015-12-26 DIAGNOSIS — Z809 Family history of malignant neoplasm, unspecified: Secondary | ICD-10-CM

## 2015-12-26 DIAGNOSIS — F1094 Alcohol use, unspecified with alcohol-induced mood disorder: Secondary | ICD-10-CM

## 2015-12-26 DIAGNOSIS — Z791 Long term (current) use of non-steroidal anti-inflammatories (NSAID): Secondary | ICD-10-CM

## 2015-12-26 HISTORY — DX: Major depressive disorder, single episode, unspecified: F32.9

## 2015-12-26 HISTORY — DX: Depression, unspecified: F32.A

## 2015-12-26 LAB — COMPREHENSIVE METABOLIC PANEL
ALBUMIN: 4.5 g/dL (ref 3.5–5.0)
ALK PHOS: 62 U/L (ref 38–126)
ALT: 31 U/L (ref 17–63)
ANION GAP: 10 (ref 5–15)
AST: 27 U/L (ref 15–41)
BUN: 15 mg/dL (ref 6–20)
CALCIUM: 9.4 mg/dL (ref 8.9–10.3)
CHLORIDE: 104 mmol/L (ref 101–111)
CO2: 24 mmol/L (ref 22–32)
Creatinine, Ser: 0.99 mg/dL (ref 0.61–1.24)
GFR calc Af Amer: >60 mL/min (ref >60)
GFR calc non Af Amer: >60 mL/min (ref >60)
GLUCOSE: 97 mg/dL (ref 65–99)
Potassium: 3.8 mmol/L (ref 3.5–5.1)
SODIUM: 138 mmol/L (ref 135–145)
Total Bilirubin: 0.8 mg/dL (ref 0.3–1.2)
Total Protein: 7.6 g/dL (ref 6.5–8.1)

## 2015-12-26 LAB — ACETAMINOPHEN LEVEL

## 2015-12-26 LAB — CBC
HEMATOCRIT: 47.9 % (ref 39.0–52.0)
HEMOGLOBIN: 16.7 g/dL (ref 13.0–17.0)
MCH: 30.8 pg (ref 26.0–34.0)
MCHC: 34.9 g/dL (ref 30.0–36.0)
MCV: 88.2 fL (ref 78.0–100.0)
Platelets: 278 10*3/uL (ref 150–400)
RBC: 5.43 MIL/uL (ref 4.22–5.81)
RDW: 12.9 % (ref 11.5–15.5)
WBC: 11.3 10*3/uL — AB (ref 4.0–10.5)

## 2015-12-26 LAB — RAPID URINE DRUG SCREEN, HOSP PERFORMED
AMPHETAMINES: NOT DETECTED
BARBITURATES: NOT DETECTED
BENZODIAZEPINES: NOT DETECTED
COCAINE: NOT DETECTED
OPIATES: NOT DETECTED
TETRAHYDROCANNABINOL: NOT DETECTED

## 2015-12-26 LAB — SALICYLATE LEVEL: Salicylate Lvl: <4 mg/dL (ref 2.8–30.0)

## 2015-12-26 LAB — ETHANOL: Alcohol, Ethyl (B): 147 mg/dL — ABNORMAL HIGH (ref <5)

## 2015-12-26 MED ORDER — LORAZEPAM 1 MG PO TABS: 1.0000 mg | ORAL_TABLET | Freq: Three times a day (TID) | ORAL | Status: DC | PRN

## 2015-12-26 MED ORDER — ALUM & MAG HYDROXIDE-SIMETH 200-200-20 MG/5ML PO SUSP: 30.0000 mL | ORAL | Status: DC | PRN

## 2015-12-26 MED ORDER — VITAMIN B-1 100 MG PO TABS
100.0000 mg | ORAL_TABLET | Freq: Every day | ORAL | Status: DC
  Administered 2015-12-27 – 2015-12-30 (×4): 100 mg via ORAL
  Filled 2015-12-26 (×5): qty 1

## 2015-12-26 MED ORDER — CHLORDIAZEPOXIDE HCL 25 MG PO CAPS
25.0000 mg | ORAL_CAPSULE | Freq: Four times a day (QID) | ORAL | Status: AC
  Administered 2015-12-26 – 2015-12-27 (×3): 25 mg via ORAL
  Filled 2015-12-26 (×3): qty 1

## 2015-12-26 MED ORDER — CHLORDIAZEPOXIDE HCL 25 MG PO CAPS
25.0000 mg | ORAL_CAPSULE | ORAL | Status: AC
  Administered 2015-12-28 – 2015-12-29 (×2): 25 mg via ORAL
  Filled 2015-12-26 (×2): qty 1

## 2015-12-26 MED ORDER — LORAZEPAM 1 MG PO TABS
0.0000 mg | ORAL_TABLET | Freq: Four times a day (QID) | ORAL | Status: DC
  Administered 2015-12-26: 1 mg via ORAL
  Filled 2015-12-26: qty 1

## 2015-12-26 MED ORDER — CHLORDIAZEPOXIDE HCL 25 MG PO CAPS
25.0000 mg | ORAL_CAPSULE | Freq: Every day | ORAL | Status: AC
  Administered 2015-12-30: 25 mg via ORAL
  Filled 2015-12-26: qty 1

## 2015-12-26 MED ORDER — THIAMINE HCL 100 MG/ML IJ SOLN: 100.0000 mg | Freq: Every day | INTRAMUSCULAR | Status: DC

## 2015-12-26 MED ORDER — DULOXETINE HCL 20 MG PO CPEP
20.0000 mg | ORAL_CAPSULE | Freq: Every day | ORAL | Status: DC
  Administered 2015-12-26 – 2015-12-27 (×2): 20 mg via ORAL
  Filled 2015-12-26 (×4): qty 1

## 2015-12-26 MED ORDER — IBUPROFEN 200 MG PO TABS
600.0000 mg | ORAL_TABLET | Freq: Three times a day (TID) | ORAL | Status: DC | PRN
Start: 2015-12-26 — End: 2015-12-26

## 2015-12-26 MED ORDER — FOLIC ACID 1 MG PO TABS
1.0000 mg | ORAL_TABLET | Freq: Every day | ORAL | Status: DC
  Administered 2015-12-26: 1 mg via ORAL
  Filled 2015-12-26: qty 1

## 2015-12-26 MED ORDER — ACETAMINOPHEN 325 MG PO TABS
650.0000 mg | ORAL_TABLET | Freq: Four times a day (QID) | ORAL | Status: DC | PRN
  Administered 2015-12-26 – 2015-12-27 (×3): 650 mg via ORAL
  Filled 2015-12-26 (×3): qty 2

## 2015-12-26 MED ORDER — NICOTINE 21 MG/24HR TD PT24
21.0000 mg | MEDICATED_PATCH | Freq: Every day | TRANSDERMAL | Status: DC
  Filled 2015-12-26 (×3): qty 1

## 2015-12-26 MED ORDER — VITAMIN B-1 100 MG PO TABS
100.0000 mg | ORAL_TABLET | Freq: Every day | ORAL | Status: DC
  Administered 2015-12-26: 100 mg via ORAL
  Filled 2015-12-26: qty 1

## 2015-12-26 MED ORDER — ONDANSETRON HCL 4 MG PO TABS
4.0000 mg | ORAL_TABLET | Freq: Three times a day (TID) | ORAL | Status: DC | PRN
  Filled 2015-12-26: qty 1

## 2015-12-26 MED ORDER — LORAZEPAM 1 MG PO TABS: 0.0000 mg | ORAL_TABLET | Freq: Two times a day (BID) | ORAL | Status: DC

## 2015-12-26 MED ORDER — LOPERAMIDE HCL 2 MG PO CAPS: 2.0000 mg | ORAL_CAPSULE | ORAL | Status: AC | PRN

## 2015-12-26 MED ORDER — ONDANSETRON 4 MG PO TBDP: 4.0000 mg | ORAL_TABLET | Freq: Four times a day (QID) | ORAL | Status: AC | PRN

## 2015-12-26 MED ORDER — ADULT MULTIVITAMIN W/MINERALS CH
1.0000 | ORAL_TABLET | Freq: Every day | ORAL | Status: DC
  Administered 2015-12-26: 1 via ORAL
  Filled 2015-12-26: qty 1

## 2015-12-26 MED ORDER — MAGNESIUM HYDROXIDE 400 MG/5ML PO SUSP: 30.0000 mL | Freq: Every day | ORAL | Status: DC | PRN

## 2015-12-26 MED ORDER — ADULT MULTIVITAMIN W/MINERALS CH
1.0000 | ORAL_TABLET | Freq: Every day | ORAL | Status: DC
  Administered 2015-12-27 – 2015-12-30 (×4): 1 via ORAL
  Filled 2015-12-26 (×6): qty 1

## 2015-12-26 MED ORDER — THIAMINE HCL 100 MG/ML IJ SOLN: 100.0000 mg | Freq: Once | INTRAMUSCULAR | Status: AC

## 2015-12-26 MED ORDER — CHLORDIAZEPOXIDE HCL 25 MG PO CAPS
25.0000 mg | ORAL_CAPSULE | Freq: Three times a day (TID) | ORAL | Status: AC
  Administered 2015-12-27 – 2015-12-28 (×3): 25 mg via ORAL
  Filled 2015-12-26 (×3): qty 1

## 2015-12-26 MED ORDER — HYDROXYZINE HCL 25 MG PO TABS
25.0000 mg | ORAL_TABLET | Freq: Four times a day (QID) | ORAL | Status: AC | PRN
  Administered 2015-12-26 – 2015-12-28 (×5): 25 mg via ORAL
  Filled 2015-12-26 (×5): qty 1

## 2015-12-26 MED ORDER — CHLORDIAZEPOXIDE HCL 25 MG PO CAPS: 25.0000 mg | ORAL_CAPSULE | Freq: Four times a day (QID) | ORAL | Status: AC | PRN

## 2015-12-26 MED ORDER — ACETAMINOPHEN 325 MG PO TABS: 650.0000 mg | ORAL_TABLET | ORAL | Status: DC | PRN

## 2015-12-26 NOTE — ED Provider Notes (Signed)
[facility]-EMERGENCY DEPT Provider Note   CSN: [id number] Arrival date & time: 12/26/15  0205  By signing my name below, I, Majel Homer, attest that this documentation has been prepared under the direction and in the presence of Vernee Baines, MD . Electronically Signed: Majel Homer, Scribe. 12/26/2015. 3:09 AM.  History   Chief Complaint Chief Complaint  Patient presents with  . Medical Clearance  . Suicidal   The history is provided by the patient. No language interpreter was used.   HPI Comments: Scott Chandler is a 40 y.o. male who presents to the Emergency Department for an evaluation of suicidal ideations with a plan to hang himself. Pt reports he is taking zoloft for depression but has not taken it in 2 days; he denies running out of medication. He states he was hospitalized for his depression on 11/14/15. Pt admits to drinking EtOH for the past several days. He reports he visited the ED today because he wants to be admitted to [facility].   Past Medical History:  Diagnosis Date  . Chest pain   . GERD (gastroesophageal reflux disease)     Patient Active Problem List   Diagnosis Date Noted  . Alcohol use disorder, severe, dependence (HCC) 11/18/2015  . 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 EPIGASTRIC PAIN 10/22/2009  . FECAL OCCULT BLOOD 10/22/2009  . HYPERLIPIDEMIA 10/21/2009  . ESOPHAGITIS, HX OF 10/21/2009  . GERD 05/31/2008  . CHEST PAIN 05/31/2008  . DIARRHEA 05/31/2008    Past Surgical History:  Procedure Laterality Date  . TRANSTHORACIC ECHOCARDIOGRAM  05/18/2008   normal LVF/ mild LVH/trace mitral and tricuspid regurgitation       Home Medications    Prior to Admission medications   Medication Sig Start Date End Date Taking? Authorizing Provider  DULoxetine 40 MG CPEP Take 40 mg by mouth daily. For depression 11/18/15  Yes Sanjuana Kava, NP  omeprazole (PRILOSEC OTC) 20 MG  tablet Take 20 mg by mouth daily.   Yes Historical Provider, MD  propranolol (INDERAL) 20 MG tablet Take 1 tablet (20 mg total) by mouth 2 (two) times daily as needed (for anxiety). 11/18/15  Yes Sanjuana Kava, NP  sertraline (ZOLOFT) 100 MG tablet Take 1 tablet by mouth daily. 10/20/15  Yes Historical Provider, MD  traZODone (DESYREL) 50 MG tablet Take 1 tablet (50 mg total) by mouth at bedtime as needed for sleep. 11/18/15  Yes Sanjuana Kava, NP    Family History Family History  Problem Relation Age of Onset  . Hypertension Father   . Cancer Mother     Social History Social History  Substance Use Topics  . Smoking status: Former Games developer  . Smokeless tobacco: Never Used  . Alcohol use Yes     Allergies   Review of patient's allergies indicates no known allergies.   Review of Systems Review of Systems  Psychiatric/Behavioral: Positive for suicidal ideas. Negative for hallucinations and self-injury.  All other systems reviewed and are negative.    Physical Exam Updated Vital Signs BP 111/79 (BP Location: Right Arm)   Pulse 96   Temp 98.4 F (36.9 C) (Oral)   Resp 16   Ht 5\' 10"  (1.778 m)   Wt 240 lb (108.9 kg)   SpO2 93%   BMI 34.44 kg/m   Physical Exam  Constitutional: He appears well-developed and well-nourished.  HENT:  Head: Normocephalic.  Mouth/Throat: Oropharynx is clear and moist. No oropharyngeal  exudate.  Eyes: Conjunctivae and EOM are normal. Pupils are equal, round, and reactive to light. Right eye exhibits no discharge. Left eye exhibits no discharge. No scleral icterus.  Neck: Normal range of motion. Neck supple. No JVD present. No tracheal deviation present.  Trachea is midline. No stridor or carotid bruits.  Cardiovascular: Normal rate, regular rhythm, normal heart sounds and intact distal pulses.   No murmur heard. Pulmonary/Chest: Effort normal and breath sounds normal. No stridor. No respiratory distress. He has no wheezes. He has no rales.    Lungs CTA bilaterally.  Abdominal: Soft. Bowel sounds are normal. He exhibits no distension. There is no tenderness. There is no rebound and no guarding.  Musculoskeletal: Normal range of motion. He exhibits no edema or tenderness.  Lymphadenopathy:    He has no cervical adenopathy.  Neurological: He is alert. He has normal reflexes.  Skin: Skin is warm and dry. Capillary refill takes less than 2 seconds.  Psychiatric: He is not agitated. He expresses suicidal ideation.  Nursing note and vitals reviewed.  ED Treatments / Results   Vitals:   12/26/15 0212  BP: 111/79  Pulse: 96  Resp: 16  Temp: 98.4 F (36.9 C)    Results for orders placed or performed during the hospital encounter of 12/26/15  Comprehensive metabolic panel  Result Value Ref Range   Sodium 138 135 - 145 mmol/L   Potassium 3.8 3.5 - 5.1 mmol/L   Chloride 104 101 - 111 mmol/L   CO2 24 22 - 32 mmol/L   Glucose, Bld 97 65 - 99 mg/dL   BUN 15 6 - 20 mg/dL   Creatinine, Ser 1.61 0.61 - 1.24 mg/dL   Calcium 9.4 8.9 - 09.6 mg/dL   Total Protein 7.6 6.5 - 8.1 g/dL   Albumin 4.5 3.5 - 5.0 g/dL   AST 27 15 - 41 U/L   ALT 31 17 - 63 U/L   Alkaline Phosphatase 62 38 - 126 U/L   Total Bilirubin 0.8 0.3 - 1.2 mg/dL   GFR calc non Af Amer >60 >60 mL/min   GFR calc Af Amer >60 >60 mL/min   Anion gap 10 5 - 15  Ethanol  Result Value Ref Range   Alcohol, Ethyl (B) 147 (H) <5 mg/dL  Salicylate level  Result Value Ref Range   Salicylate Lvl <4.0 2.8 - 30.0 mg/dL  Acetaminophen level  Result Value Ref Range   Acetaminophen (Tylenol), Serum <10 (L) 10 - 30 ug/mL  cbc  Result Value Ref Range   WBC 11.3 (H) 4.0 - 10.5 K/uL   RBC 5.43 4.22 - 5.81 MIL/uL   Hemoglobin 16.7 13.0 - 17.0 g/dL   HCT 04.5 40.9 - 81.1 %   MCV 88.2 78.0 - 100.0 fL   MCH 30.8 26.0 - 34.0 pg   MCHC 34.9 30.0 - 36.0 g/dL   RDW 91.4 78.2 - 95.6 %   Platelets 278 150 - 400 K/uL  Rapid urine drug screen (hospital performed)  Result Value Ref  Range   Opiates NONE DETECTED NONE DETECTED   Cocaine NONE DETECTED NONE DETECTED   Benzodiazepines NONE DETECTED NONE DETECTED   Amphetamines NONE DETECTED NONE DETECTED   Tetrahydrocannabinol NONE DETECTED NONE DETECTED   Barbiturates NONE DETECTED NONE DETECTED   No results found.  Medications  LORazepam (ATIVAN) tablet 1 mg (not administered)  acetaminophen (TYLENOL) tablet 650 mg (not administered)  ibuprofen (ADVIL,MOTRIN) tablet 600 mg (not administered)  alum & mag hydroxide-simeth (MAALOX/MYLANTA) 200-200-20  MG/5ML suspension 30 mL (not administered)  ondansetron (ZOFRAN) tablet 4 mg (not administered)    Radiology No results found.  Procedures Procedures  DIAGNOSTIC STUDIES:  Oxygen Saturation is 93% on RA, adequate by my interpretation.    COORDINATION OF CARE:  3:06 AM Discussed treatment plan with pt at bedside and pt agreed to plan.  Medications Ordered in ED Medications - No data to display  Initial Impression / Assessment and Plan / ED Course  I have reviewed the triage vital signs and the nursing notes.  Pertinent labs & imaging results that were available during my care of the patient were reviewed by me and considered in my medical decision making (see chart for details).  Clinical Course    [facility]  I personally performed the services described in this documentation, which was scribed in my presence. The recorded information has been reviewed and is accurate.   Final Clinical Impressions(s) / ED Diagnoses   Final diagnoses:  None    New Prescriptions New Prescriptions   No medications on file     Coleston Dirosa, MD 12/26/15 [phone]

## 2015-12-26 NOTE — [facility] Suicide Risk Assessment (Signed)
[facility] Admission Suicide Risk Assessment   Nursing information obtained from:  Patient Demographic factors:  Male, Caucasian, Low socioeconomic status Current Mental Status:  Suicidal ideation indicated by patient, Suicide plan, Plan includes specific time, place, or method, Self-harm thoughts, Self-harm behaviors, Intention to act on suicide plan, Belief that plan would result in death Loss Factors:  Loss of significant relationship, Decline in physical health, Financial problems / change in socioeconomic status Historical Factors:  Family history of mental illness or substance abuse Risk Reduction Factors:  Positive social support  Total Time spent with patient: 30 minutes Principal Problem: Alcohol use disorder, severe, dependence (HCC) Diagnosis:   Patient Active Problem List   Diagnosis Date Noted  . Alcohol use disorder, severe, dependence (HCC) [F10.20] 11/18/2015  . Alcohol abuse with alcohol-induced mood disorder (HCC) [F10.14] 11/14/2015  . EPIGASTRIC PAIN [R10.13] 10/22/2009  . FECAL OCCULT BLOOD [R19.5] 10/22/2009  . HYPERLIPIDEMIA [E78.5] 10/21/2009  . ESOPHAGITIS, HX OF [Z87.19] 10/21/2009  . GERD [K21.9] 05/31/2008  . CHEST PAIN [R07.9] 05/31/2008  . DIARRHEA [R19.7] 05/31/2008   Subjective Data:  Scott Chandler is a 40 y.o. male with alcohol use disorder, who was discharged from [facility] in July, presents to the Emergency Department for an evaluation of suicidal ideations with a plan to hang himself.   He states that he was brought from [place] to here by his wife for alcohol use. He moved to live with his girlfriend in [place], and was hoping to get a job. He has been drinking a litter of vodka every day, last drink last night (EtOH 147 on 8/31, UDS negative). He denies other drug use. He states he has been drinking alcohol for his chronic back pain, although it only temporarily relieves it. He just "like to get drunk." He is hoping to go to residential treatment. The longest  sobriety was for 2 months. He finds ativan to be helpful at that time. He has tried naltrexone but it made him vomit.   He denies feeling depressed but states he has had SI of trying to hang himself. He feels anxious but denies recent panic attack. He denies any previous suicide attempt.   FHx:  No suicide attempt Daughter bipolar disorder (age 33, currently in group home)  Continued Clinical Symptoms:  Alcohol Use Disorder Identification Test Final Score (AUDIT): 32 The "Alcohol Use Disorders Identification Test", Guidelines for Use in Primary Care, Second Edition.  World Science writer [facility]). Score between 0-7:  no or low risk or alcohol related problems. Score between 8-15:  moderate risk of alcohol related problems. Score between 16-19:  high risk of alcohol related problems. Score 20 or above:  warrants further diagnostic evaluation for alcohol dependence and treatment.  CLINICAL FACTORS:   Depression:   Comorbid alcohol abuse/dependence   Musculoskeletal: Strength & Muscle Tone: within normal limits Gait & Station: normal Patient leans: N/A  Psychiatric Specialty Exam: Physical Exam  Nursing note and vitals reviewed. Constitutional: He is oriented to person, place, and time. He appears well-developed and well-nourished.  Neurological: He is alert and oriented to person, place, and time.  No tremors    Review of Systems  Eyes: Positive for blurred vision.  Cardiovascular: Positive for palpitations. Negative for chest pain.  Neurological: Positive for dizziness and tremors.    Blood pressure (!) 138/92, pulse (!) 102, temperature 99.3 F (37.4 C), temperature source Oral, resp. rate 18, height 5\' 10"  (1.778 m), weight 240 lb (108.9 kg), SpO2 98 %.Body mass index is 34.44  kg/m.  General Appearance: Casual  Eye Contact:  Good  Speech:  Clear and Coherent  Volume:  Normal  Mood:  Anxious  Affect:  anixous  Thought Process:  Coherent and Goal Directed  Orientation:   Full (Time, Place, and Person)  Thought Content:  Logical Perceptions: denies AH/VH  Suicidal Thoughts:  No  Homicidal Thoughts:  No  Memory:  intact to recent and remote history  Judgement:  Fair  Insight:  Present  Psychomotor Activity:  Normal  Concentration:  Concentration: Fair and Attention Span: Fair  Recall:  Good  Fund of Knowledge:  Good  Language:  Good  Akathisia:  No  Handed:  Right  AIMS (if indicated):     Assets:  Communication Skills Desire for Improvement  ADL's:  Intact  Cognition:  WNL  Sleep:         COGNITIVE FEATURES THAT CONTRIBUTE TO RISK:  None    SUICIDE RISK:   Mild:  Suicidal ideation of limited frequency, intensity, duration, and specificity.  There are no identifiable plans, no associated intent, mild dysphoria and related symptoms, good self-control (both objective and subjective assessment), few other risk factors, and identifiable protective factors, including available and accessible social support.   PLAN OF CARE: Patient will be admitted to inpatient psychiatric unit for stabilization and safety. Will provide and encourage milieu participation. Provide medication management and make adjustments as needed.  Will follow daily.   I certify that inpatient services furnished can reasonably be expected to improve the patient's condition.  Neysa Hottereina Yannely Kintzel, MD 12/26/2015, 2:53 PM

## 2015-12-26 NOTE — ED Notes (Signed)
Patient has been quiet and cooperative this morning.  [facility]  He was given some Ativan and Zoloft prior to release.  [facility] with [employer].  All belongings sent with the driver.

## 2015-12-26 NOTE — H&P (Signed)
Psychiatric Admission Assessment Adult  Patient Identification: Scott [facility]  MRN:  [id number]  Date of Evaluation:  12/26/2015  Chief Complaint: " I have been drinking a lot "  Principal Diagnosis:  Alcohol Dependence, Alcohol Induced  Mood Disorder, Depressed   Diagnosis:   Patient Active Problem List   Diagnosis Date Noted  . Alcohol use disorder, severe, dependence ([facility]) [F10.20] 11/18/2015  . Alcohol abuse with alcohol-induced mood disorder ([facility]) [F10.14] 11/14/2015  . EPIGASTRIC PAIN [R10.13] 10/22/2009  . FECAL OCCULT BLOOD [R19.5] 10/22/2009  . HYPERLIPIDEMIA [E78.5] 10/21/2009  . ESOPHAGITIS, HX OF [Z87.19] 10/21/2009  . GERD [K21.9] 05/31/2008  . CHEST PAIN [R07.9] 05/31/2008  . DIARRHEA [R19.7] 05/31/2008   History of Present Illness: This is the second admission assessment in this [facility]  for Scott [facility], a 40 year old Caucasian male with hx of chronic alcoholism. He was discharged from this [facility] after alcohol detoxification treatments in July 29th of this year (2017).  Scott [facility] is being re-admitted to the [facility] adult unit from the [facility] with complaints of suicidal ideations with plans to hang himself. His BAL upon admission to the [facility] was 147. During this admission assessment, He reports, "My wife dropped me off at the [facility] last night. I have been drinking heavily for several days, up to a litre of liquor (Vodka) daily. I'm an alcoholic. I have been drinking heavily since 2009. There are a lot of reasons why I drink so much. My daughters are both in a group [facility] suffering from bipolar disorders. They both got issues of their own. I have not been able to work since August 7th of this year. I resigned my job thinking that I was going to get a job in [place] that fell thru. So, I'm battling financial troubles as well. I have been depressed for many years. Was on Zoloft, gave me sexual problems. I have tried Cymbalta too. This time around, I would like to go  to a long term substance treatment [facility] after detox. I don not use any kind of drugs".  Associated Signs/Symptoms: Depression Symptoms:  depressed mood, insomnia, loss of energy/fatigue, decreased appetite,  (Hypo) Manic Symptoms: Denies any hypomanic symptoms.  Anxiety Symptoms:   States he worries excessively, " I feel like I worry about everything "  Psychotic Symptoms: Denies any hallucinations, delusional thoughts or paranoia.   PTSD Symptoms: Denies any history or symptoms.  Total Time spent with patient:  1 hour  Past Psychiatric History: This is his 2nd psychiatric admission, no history of suicide attempts, denies history of self cutting, or self injurious ideations, denies history of violence. Does not describe any clear history of mania or hypomania .   Is the patient at risk to self? No.  Has the patient been a risk to self in the past 6 months? Yes- Has the patient been a risk to self within the distant past? No.  Is the patient a risk to others? No.  Has the patient been a risk to others in the past 6 months? No.  Has the patient been a risk to others within the distant past? No.   Prior Inpatient Therapy: Yes, [facility], July, 2017 Prior Outpatient Therapy: Yes   Alcohol Screening:  Substance Abuse History in the last 12 months: Yes, alcohol dependence, as above. States he has a remote history of abusing cocaine and cannabis, but states he has not used drugs in years .  Consequences of Substance Abuse: No history of  DUI, history of some blackouts, denies history of seizures, states he has had " bad withdrawals", and describes  transient visual disturbances, hallucinations when detoxing at times, but not full blown episode of DTs .  Longest period of sobriety 2-3 months .  Previous Psychotropic Medications: States he has been on Zoloft, since November 2016, causes sexual side effects. States he stopped taking it. In the past has been on Ativan, but not recently. In the  past has tried Naltrexone, but states "it really does not work that well" and has not been taking it recently .  Psychological Evaluations: Yes   Past Medical History: History of hiatal hernia, history of back pain due to " bulging discs ". Past Medical History:  Diagnosis Date  . Chest pain   . GERD (gastroesophageal reflux disease)     Past Surgical History:  Procedure Laterality Date  . TRANSTHORACIC ECHOCARDIOGRAM  05/18/2008   normal LVF/ mild LVH/trace mitral and tricuspid regurgitation   Family History: Parents divorced when he was a young boy, both are deceased, father died from [facility], mother died from Breast Cancer . Has two half siblings, one half brother died in a MVA  Family History  Problem Relation Age of Onset  . Hypertension Father   . Cancer Mother    Family Psychiatric  History: States mother had a " nervous break down" once, had history of depression, no suicides in the family, states his adolescent daughter has been diagnosed with bipolar disorder, grandfather was an alcoholic.   Tobacco Screening:  Does not smoke   Social History:  Married x 2, has one daughter, aged 35. Employed [facility] employee), denies legal issues, describes some financial concerns , due to student debt .  History  Alcohol Use  . Yes     History  Drug use: Unknown    Additional Social History:  Allergies:  No Known Allergies  Lab Results:  Results for orders placed or performed during the [facility] encounter of 12/26/15 (from the past 48 hour(s))  Rapid urine drug screen ([facility] performed)     Status: None   Collection Time: 12/26/15  2:17 AM  Result Value Ref Range   Opiates NONE DETECTED NONE DETECTED   Cocaine NONE DETECTED NONE DETECTED   Benzodiazepines NONE DETECTED NONE DETECTED   Amphetamines NONE DETECTED NONE DETECTED   Tetrahydrocannabinol NONE DETECTED NONE DETECTED   Barbiturates NONE DETECTED NONE DETECTED    Comment:        DRUG SCREEN FOR MEDICAL PURPOSES ONLY.   IF CONFIRMATION IS NEEDED FOR ANY PURPOSE, NOTIFY LAB WITHIN 5 DAYS.        LOWEST DETECTABLE LIMITS FOR URINE DRUG SCREEN Drug Class       Cutoff (ng/mL) Amphetamine      1000 Barbiturate      200 Benzodiazepine   163 Tricyclics       846 Opiates          300 Cocaine          300 THC              50   Comprehensive metabolic panel     Status: None   Collection Time: 12/26/15  2:30 AM  Result Value Ref Range   Sodium 138 135 - 145 mmol/L   Potassium 3.8 3.5 - 5.1 mmol/L   Chloride 104 101 - 111 mmol/L   CO2 24 22 - 32 mmol/L   Glucose, Bld 97 65 - 99 mg/dL  BUN 15 6 - 20 mg/dL   Creatinine, Ser 0.99 0.61 - 1.24 mg/dL   Calcium 9.4 8.9 - 10.3 mg/dL   Total Protein 7.6 6.5 - 8.1 g/dL   Albumin 4.5 3.5 - 5.0 g/dL   AST 27 15 - 41 U/L   ALT 31 17 - 63 U/L   Alkaline Phosphatase 62 38 - 126 U/L   Total Bilirubin 0.8 0.3 - 1.2 mg/dL   GFR calc non Af Amer >60 >60 mL/min   GFR calc Af Amer >60 >60 mL/min    Comment: (NOTE) The eGFR has been calculated using the CKD EPI equation. This calculation has not been validated in all clinical situations. eGFR's persistently <60 mL/min signify possible Chronic Kidney Disease.    Anion gap 10 5 - 15  Ethanol     Status: Abnormal   Collection Time: 12/26/15  2:30 AM  Result Value Ref Range   Alcohol, Ethyl (B) 147 (H) <5 mg/dL    Comment:        LOWEST DETECTABLE LIMIT FOR SERUM ALCOHOL IS 5 mg/dL FOR MEDICAL PURPOSES ONLY   Salicylate level     Status: None   Collection Time: 12/26/15  2:30 AM  Result Value Ref Range   Salicylate Lvl <3.5 2.8 - 30.0 mg/dL  Acetaminophen level     Status: Abnormal   Collection Time: 12/26/15  2:30 AM  Result Value Ref Range   Acetaminophen (Tylenol), Serum <10 (L) 10 - 30 ug/mL    Comment:        THERAPEUTIC CONCENTRATIONS VARY SIGNIFICANTLY. A RANGE OF 10-30 ug/mL MAY BE AN EFFECTIVE CONCENTRATION FOR MANY PATIENTS. HOWEVER, SOME ARE BEST TREATED AT CONCENTRATIONS OUTSIDE  THIS RANGE. ACETAMINOPHEN CONCENTRATIONS >150 ug/mL AT 4 HOURS AFTER INGESTION AND >50 ug/mL AT 12 HOURS AFTER INGESTION ARE OFTEN ASSOCIATED WITH TOXIC REACTIONS.   cbc     Status: Abnormal   Collection Time: 12/26/15  2:30 AM  Result Value Ref Range   WBC 11.3 (H) 4.0 - 10.5 K/uL   RBC 5.43 4.22 - 5.81 MIL/uL   Hemoglobin 16.7 13.0 - 17.0 g/dL   HCT 47.9 39.0 - 52.0 %   MCV 88.2 78.0 - 100.0 fL   MCH 30.8 26.0 - 34.0 pg   MCHC 34.9 30.0 - 36.0 g/dL   RDW 12.9 11.5 - 15.5 %   Platelets 278 150 - 400 K/uL   Blood Alcohol level:  Lab Results  Component Value Date   ETH 147 (H) 12/26/2015   ETH 21 (H) 36/14/4315   Metabolic Disorder Labs:  No results found for: HGBA1C, MPG No results found for: PROLACTIN No results found for: CHOL, TRIG, HDL, CHOLHDL, VLDL, LDLCALC  Current Medications: Current Facility-Administered Medications  Medication Dose Route Frequency Provider Last Rate Last Dose  . acetaminophen (TYLENOL) tablet 650 mg  650 mg Oral Q6H PRN Encarnacion Slates, NP      . alum & mag hydroxide-simeth (MAALOX/MYLANTA) 200-200-20 MG/5ML suspension 30 mL  30 mL Oral Q4H PRN Encarnacion Slates, NP      . chlordiazePOXIDE (LIBRIUM) capsule 25 mg  25 mg Oral Q6H PRN Encarnacion Slates, NP      . chlordiazePOXIDE (LIBRIUM) capsule 25 mg  25 mg Oral QID Encarnacion Slates, NP       Followed by  . [START ON 12/27/2015] chlordiazePOXIDE (LIBRIUM) capsule 25 mg  25 mg Oral TID Encarnacion Slates, NP       Followed by  . [  START ON 12/28/2015] chlordiazePOXIDE (LIBRIUM) capsule 25 mg  25 mg Oral BH-qamhs Encarnacion Slates, NP       Followed by  . [START ON 12/30/2015] chlordiazePOXIDE (LIBRIUM) capsule 25 mg  25 mg Oral Daily Encarnacion Slates, NP      . hydrOXYzine (ATARAX/VISTARIL) tablet 25 mg  25 mg Oral Q6H PRN Encarnacion Slates, NP      . loperamide (IMODIUM) capsule 2-4 mg  2-4 mg Oral PRN Encarnacion Slates, NP      . magnesium hydroxide (MILK OF MAGNESIA) suspension 30 mL  30 mL Oral Daily PRN Encarnacion Slates, NP       . multivitamin with minerals tablet 1 tablet  1 tablet Oral Daily Encarnacion Slates, NP      . nicotine (NICODERM CQ - dosed in mg/24 hours) patch 21 mg  21 mg Transdermal Q0600 Encarnacion Slates, NP      . ondansetron (ZOFRAN-ODT) disintegrating tablet 4 mg  4 mg Oral Q6H PRN Encarnacion Slates, NP      . thiamine (B-1) injection 100 mg  100 mg Intramuscular Once Encarnacion Slates, NP      . Derrill Memo ON 12/27/2015] thiamine (VITAMIN B-1) tablet 100 mg  100 mg Oral Daily Encarnacion Slates, NP       PTA Medications: Prescriptions Prior to Admission  Medication Sig Dispense Refill Last Dose  . DULoxetine 40 MG CPEP Take 40 mg by mouth daily. For depression 30 capsule 0 Past Month at Unknown time  . omeprazole (PRILOSEC OTC) 20 MG tablet Take 20 mg by mouth daily.   12/25/2015 at Unknown time  . propranolol (INDERAL) 20 MG tablet Take 1 tablet (20 mg total) by mouth 2 (two) times daily as needed (for anxiety). 60 tablet 0 Past Month at Unknown time  . sertraline (ZOLOFT) 100 MG tablet Take 1 tablet by mouth daily.  3 12/25/2015 at Unknown time  . traZODone (DESYREL) 50 MG tablet Take 1 tablet (50 mg total) by mouth at bedtime as needed for sleep. 30 tablet 0 Past Month at Unknown time   Musculoskeletal: Strength & Muscle Tone: within normal limits, no tremors, no diaphoresis, no psychomotor restlessness,  facial flushing   Gait & Station: normal  Patient leans: N/A  Psychiatric Specialty Exam: Physical Exam  Constitutional: He appears well-developed.  HENT:  Head: Normocephalic.  Eyes: Pupils are equal, round, and reactive to light.  Neck: Normal range of motion.    Review of Systems  Constitutional: Negative.   HENT: Negative.   Eyes: Negative.   Respiratory: Negative.   Cardiovascular: Negative.   Gastrointestinal: Positive for diarrhea, heartburn and nausea. Negative for abdominal pain, blood in stool, melena and vomiting.  Genitourinary: Negative.   Musculoskeletal: Positive for back pain.  Skin:  Negative.   Neurological: Negative for seizures.  Endo/Heme/Allergies: Negative.   Psychiatric/Behavioral: Positive for depression, substance abuse and suicidal ideas.  All other systems reviewed and are negative.   There were no vitals taken for this visit.There is no height or weight on file to calculate BMI.  General Appearance: Fairly Groomed  Eye Contact:  Good  Speech:  Clear and Coherent and Normal Rate  Volume:  Normal  Mood:  Depressed, denies any anxiousness.  Affect: Restricted  Thought Process:  Coherent and Goal Directed  Orientation:  Full (Time, Place, and Person)  Thought Content:  Denies any hallucinations, delusional thoughts or paranoia, not internally preoccupied   Suicidal Thoughts:  No  denies any suicidal ideations, plans or intent at this time, contracts for safety on the unit   Homicidal Thoughts:  No denies any homicidal thoughts, plans or intent.  Memory:  Immediate;   Good Recent;   Good Remote;   Good  Judgement:  Fair  Insight:  Fair  Psychomotor Activity:  Normal  Concentration:  Concentration: Good and Attention Span: Good  Recall:  Good  Fund of Knowledge:  Good  Language:  Good  Akathisia:  Negative  Handed:  Right  AIMS (if indicated):     Assets:  Communication Skills Desire for Improvement Resilience  ADL's:  Intact  Cognition:  WNL  Sleep: "Usually good"   Treatment Plan Summary: Daily contact with patient to assess and evaluate symptoms and progress in treatment and Medication management: 1. Admit for crisis management and stabilization, estimated length of stay 3-5 days.  2. Medication management to reduce current symptoms to base line and improve the patient's overall level of functioning: See [facility] for med lists.  3. Treat health problems as indicated.  4. Develop treatment plan to decrease risk of relapse upon discharge and the need for readmission.  5. Psycho-social education regarding relapse prevention and self care.  6. Health  care follow up as needed for medical problems.  7. Review, reconcile, and reinstate any pertinent [facility] medications for other health issues where appropriate. 8. Call for consults with hospitalist for any additional specialty patient care services as needed.  Observation Level/Precautions:  15 minute checks  Laboratory: Per [facility]  Psychotherapy: Milieu, support    Medications: See MAR  Consultations:  As needed   Discharge Concerns: Mood stability, maintaining sobreity  Estimated LOS: 5 days   Other:     I certify that inpatient services furnished can reasonably be expected to improve the patient's condition.    Encarnacion Slates, NP, PMHNP, FNP-BC 8/31/201711:05 AM

## 2015-12-26 NOTE — ED Notes (Addendum)
Pt has in belonging bag:  [employer] sandals, black pants, grey shirt, grey socks, brown paper bag of clothes

## 2015-12-26 NOTE — ED Notes (Signed)
Patient A/o, no noted distress. Patient expresses depression states he plans to hang self. Patient states he is tired and just don't feel like himself. Flat affect. Patient contracted to safety. Patient slept well throughout the shift. Staff will continue to monitor. Rep[facility]

## 2015-12-26 NOTE — BH Assessment (Addendum)
Tele Assessment Note   Scott Chandler is an 40 y.o. male.  -Pt came in as a walk-in to [facility] with his estranged wife Scott Chandler. She said that patient had called her today.  Patient and wife are separated.  Patient was staying at his girlfriend's home in [place].  Patient told wife that he wanted to come home.  The [place] girlfriend told wife that she was about to call the police on him.  Wife told her she would come and get him.  She did and patient said that he wanted to go home and take a shower before coming in to the hospital for detox.  Patient actually then eloped to go buy liquor.  Wife called police, who brought patient back to the home.  Patient agreed to come to [facility] with estranged wife.  She said that she told him he needed to come in or she will IVC him.  Patient said that he just wants to go out and get a drink.  He has been thinking of hanging himself in his condo.  Patient says however that he is essentially homeless.  Patient has no previous suicide attempts but has had SI within the last 6 months.    Patient denies any HI or A/V hallucinations.  Patient says that he has been drinking at least 1 liter of vodka daily for the last 3 days.  He drank slightly less than that prior.  Patient says that his longest period of sobriety has been 1 week.  Patient drank around 00:30 today, just prior to coming in to [facility]  Patient was at [facility] in July 2017 for same.  He says he started drinking again after discharge then.  His wife thinks that this will be different.  Patient also recently resigned his job.  He has no outpatient care.  -Clinician discussed patient with Scott Clan, PA who said that patient met inpatient criteria.  Patient accepted to [facility] 306-2.  Tori, [facility] said that patient can come over after 08:00.  Clinician contacted Anderson Malta (charge nurse at [facility]) and informed her of med clearance and that patient can come over after 08:00.  Patient has signed voluntary admission forms.  Diagnosis:  MDD, recurrent, severe; ETOH use d/o severe  Past Medical History:  Past Medical History:  Diagnosis Date  . Chest pain   . GERD (gastroesophageal reflux disease)     Past Surgical History:  Procedure Laterality Date  . TRANSTHORACIC ECHOCARDIOGRAM  05/18/2008   normal LVF/ mild LVH/trace mitral and tricuspid regurgitation    Family History:  Family History  Problem Relation Age of Onset  . Hypertension Father   . Cancer Mother     Social History:  reports that he has quit smoking. He does not have any smokeless tobacco history on file. He reports that he drinks alcohol. His drug history is not on file.  Additional Social History:  Alcohol / Drug Use Pain Medications: None Prescriptions: Zoloft 71m 1/2 pill, Propanolol Over the Counter: Ibuprophen as needed. Olmetrezol (heart burn) Longest period of sobriety (when/how long): One week this year. Negative Consequences of Use: Personal relationships Withdrawal Symptoms: Nausea / Vomiting, Fever / Chills, Diarrhea, Sweats, Tremors, Patient aware of relationship between substance abuse and physical/medical complications, Blackouts Substance #1 Name of Substance 1: ETOH (usually vodka) 1 - Age of First Use: 20's 1 - Amount (size/oz): "As much as I can get" About a liter per day "As much as I can afford." 1 - Frequency: Daily 1 -  Duration: Last three days at that rate.  A little less prior to that. 1 - Last Use / Amount: 08/31 Around 00:30 was last drink.  Drank prior to arrival.  CIWA:   COWS:    PATIENT STRENGTHS: (choose at least two) Ability for insight Average or above average intelligence Capable of independent living Communication skills Supportive family/friends  Allergies: No Known Allergies  Home Medications:  (Not in a hospital admission)  OB/GYN Status:  No LMP for male patient.  General Assessment Data Location of Assessment: [facility] Assessment Services TTS Assessment: In system Is this a Tele or  Face-to-Face Assessment?: Face-to-Face Is this an Initial Assessment or a Re-assessment for this encounter?: Initial Assessment Marital status: Separated Is patient pregnant?: No Pregnancy Status: No Living Arrangements: Other (Comment) (Pt is basically homeless.) Can pt return to current living arrangement?: Yes Admission Status: Voluntary Is patient capable of signing voluntary admission?: Yes Referral Source: Self/Family/Friend (Estranged wife Scott Chandler brought him to [facility]) Insurance type: self pay     Crisis Care Plan Living Arrangements: Other (Comment) (Pt is basically homeless.) Name of Psychiatrist: none Name of Therapist: none  Education Status Is patient currently in school?: No Highest grade of school patient has completed: BS in Geography  Risk to self with the past 6 months Suicidal Ideation: Yes-Currently Present Has patient been a risk to self within the past 6 months prior to admission? : Yes Suicidal Intent: Yes-Currently Present Has patient had any suicidal intent within the past 6 months prior to admission? : Yes Is patient at risk for suicide?: Yes Suicidal Plan?: Yes-Currently Present Has patient had any suicidal plan within the past 6 months prior to admission? : Yes Specify Current Suicidal Plan: hang myself Access to Means: Yes Specify Access to Suicidal Means: Rope What has been your use of drugs/alcohol within the last 12 months?: ETOH Previous Attempts/Gestures: No How many times?: 0 Other Self Harm Risks: None Triggers for Past Attempts: None known Intentional Self Injurious Behavior: None Family Suicide History: No Recent stressful life event(s): Divorce, Financial Problems, Job Loss, Other (Comment) (Homelessness) Persecutory voices/beliefs?: No Depression: Yes Depression Symptoms: Despondent, Loss of interest in usual pleasures, Insomnia, Isolating Substance abuse history and/or treatment for substance abuse?: Yes Suicide prevention  information given to non-admitted patients: Not applicable  Risk to Others within the past 6 months Homicidal Ideation: No Does patient have any lifetime risk of violence toward others beyond the six months prior to admission? : No Thoughts of Harm to Others: No Current Homicidal Intent: No Current Homicidal Plan: No Access to Homicidal Means: No Identified Victim: No one History of harm to others?: No Assessment of Violence: None Noted Violent Behavior Description: None reported Does patient have access to weapons?: No Criminal Charges Pending?: No Does patient have a court date: No Is patient on probation?: No  Psychosis Hallucinations: None noted Delusions: None noted  Mental Status Report Appearance/Hygiene: Disheveled Eye Contact: Poor Motor Activity: Freedom of movement, Unremarkable Speech: Logical/coherent Level of Consciousness: Alert Mood: Depressed, Despair, Helpless, Guilty, Anxious Affect: Anxious, Sad, Depressed Anxiety Level: Moderate Panic attack frequency: None lately Most recent panic attack: Can't recall Thought Processes: Coherent, Relevant Judgement: Impaired Orientation: Person, Place, Situation Obsessive Compulsive Thoughts/Behaviors: None  Cognitive Functioning Concentration: Poor Memory: Recent Impaired, Remote Intact IQ: Average Insight: Fair Impulse Control: Poor Appetite: Fair Weight Loss: 0 Weight Gain: 0 Sleep: Decreased Total Hours of Sleep:  (<5H/D) Vegetative Symptoms: Staying in bed  ADLScreening [facility] Assessment Services) Patient's cognitive ability  adequate to safely complete daily activities?: Yes Patient able to express need for assistance with ADLs?: Yes Independently performs ADLs?: Yes (appropriate for developmental age)  Prior Inpatient Therapy Prior Inpatient Therapy: Yes Prior Therapy Dates: July 2017 Prior Therapy Facilty/Provider(s): [facility] Reason for Treatment: SA, SI  Prior Outpatient Therapy Prior Outpatient  Therapy: No Prior Therapy Dates: n/a Prior Therapy Facilty/Provider(s): n/a Reason for Treatment: n/a Does patient have an ACCT team?: No Does patient have Intensive In-House Services?  : No Does patient have [facility] services? : No Does patient have P4CC services?: No  ADL Screening (condition at time of admission) Patient's cognitive ability adequate to safely complete daily activities?: Yes Is the patient deaf or have difficulty hearing?: No Does the patient have difficulty seeing, even when wearing glasses/contacts?: No (Wears contacts.) Does the patient have difficulty concentrating, remembering, or making decisions?: Yes Patient able to express need for assistance with ADLs?: Yes Does the patient have difficulty dressing or bathing?: No Independently performs ADLs?: Yes (appropriate for developmental age) Does the patient have difficulty walking or climbing stairs?: No Weakness of Legs: None Weakness of Arms/Hands: None       Abuse/Neglect Assessment (Assessment to be complete while patient is alone) Physical Abuse: Denies Verbal Abuse: Denies Sexual Abuse: Denies Exploitation of patient/patient's resources: Denies Self-Neglect: Denies     Regulatory affairs officer (For Healthcare) Does patient have an advance directive?: No Would patient like information on creating an advanced directive?: No - patient declined information    Additional Information 1:1 In Past 12 Months?: No CIRT Risk: No Elopement Risk: No Does patient have medical clearance?: No     Disposition:  Disposition Initial Assessment Completed for this Encounter: Yes Disposition of Patient: Inpatient treatment program, Referred to Type of inpatient treatment program: Adult Patient referred to: Other (Comment) (Pt to be reviewed with PA)  Curlene Dolphin Ray 12/26/2015 1:32 AM

## 2015-12-26 NOTE — Progress Notes (Signed)
Assumed care of patient from admitting nurse, Otto Herbreka RN. Patient up and visible on unit. Flat in affect, depressed and anxious. Reports minimal withdrawal - CIWAs at a "2", VSS. Emotional support offered. Medicated per orders. Patient denies SI/HI and remains safe on level III obs.
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
## 2015-12-26 NOTE — Tx Team (Signed)
Initial Treatment Plan 12/26/2015 3:04 PM Scott Chandler [id number]    PATIENT STRESSORS: Financial difficulties Health problems Marital or family conflict Occupational concerns Substance abuse   PATIENT STRENGTHS: Average or above average intelligence Communication skills Motivation for treatment/growth   PATIENT IDENTIFIED PROBLEMS: At risk for suicide  Depression  Substance Abuse  "Staying sober"  "Getting some long-term treatment"             DISCHARGE CRITERIA:  Ability to meet basic life and health needs Adequate post-discharge living arrangements Improved stabilization in mood, thinking, and/or behavior Motivation to continue treatment in a less acute level of care Need for constant or close observation no longer present Withdrawal symptoms are absent or subacute and managed without 24-hour nursing intervention  PRELIMINARY DISCHARGE PLAN: Attend 12-step recovery group Outpatient therapy Placement in alternative living arrangements  PATIENT/FAMILY INVOLVEMENT: This treatment plan has been presented to and reviewed with the patient, Scott Chandler.  The patient and family have been given the opportunity to ask questions and make suggestions.  Carleene OverlieMiddleton, Duvall Comes P, RN 12/26/2015, 3:04 PM

## 2015-12-26 NOTE — BH Assessment (Signed)
BHH Assessment Progress Note  Per Donell SievertSpencer Simon, PA, this pt requires psychiatric hospitalization at this time.  Clint Bolderori Beck, RN, [facility]  Pt has signed Voluntary Admission and Consent for Treatment, as well as Consent to Release Information to no one, and signed [facility].  Pt's nurse, Rudean HittDawnaly, has been notified, and agrees to send original paperwork along with pt via Juel Burrowelham, and to call report to [phone].  Doylene Canninghomas Sila Sarsfield, MA Triage Specialist [phone]

## 2015-12-26 NOTE — BH Assessment (Signed)
BHH Assessment Progress Note  Clinician was informed by Bunnie Pionori, AC that [facility] after 11:00 on 08/31.  Accepting psychiatrist is Dr. Jama Flavorsobos.

## 2015-12-26 NOTE — [facility] Group Notes (Signed)

## 2015-12-26 NOTE — ED Notes (Signed)
[facility]

## 2015-12-26 NOTE — ED Notes (Signed)
Bed: WTR5 Expected date:  Expected time:  Means of arrival:  Comments: 

## 2015-12-26 NOTE — BHH Group Notes (Signed)
BHH LCSW Group Therapy 12/26/2015  1:15 PM   Type of Therapy: Group Therapy  Participation Level: Did Not Attend. Patient invited to participate but declined.   Willadeen Colantuono, MSW, LCSW Clinical Social Worker [facility] [phone]   

## 2015-12-26 NOTE — Progress Notes (Signed)
Admission Note:  40 yr old male who presents voluntary, in no acute distress, for the treatment of SI, Depression, and Substance Abuse. Patient appears flat and depressed. Patient was calm and cooperative with admission process. Patient reports SI with plan to "hang myself in my girlfriends closet", prior to admission.  Patient currently denies SI and contracts for safety upon admission and states SI "comes and goes". Patient denies AVH.  Patient reports that he recently [place] and [place].  Patient reports that he had a job lined up for when he got to [place] but the job "fell through".  Patient reports that girlfriend "kicked" him out of the home due to his increase drinking.  Patient is currently homeless and is unable to identify a support system.  Patient states "my wife may take me back".  Patient is looking for long term treatment upon discharge.  [facility] patient's goal is to "stay sober" and "Get some long term treatment".  Skin was assessed by Foy Guadalajarahrista, RN.  Patient searched and no contraband found, POC and unit policies explained and understanding verbalized. Consents obtained. Patient had no additional questions or concerns.

## 2015-12-26 NOTE — ED Triage Notes (Signed)
Pt presents to ED for medical clearance. States that he has been drinking for several days. He states that he is depressed and is having suicidal ideations with a plan to hang himself. A&Ox4.

## 2015-12-27 MED ORDER — DULOXETINE HCL 20 MG PO CPEP
40.0000 mg | ORAL_CAPSULE | Freq: Every day | ORAL | Status: DC
  Administered 2015-12-28 – 2015-12-30 (×3): 40 mg via ORAL
  Filled 2015-12-27 (×4): qty 2

## 2015-12-27 MED ORDER — GABAPENTIN 300 MG PO CAPS
300.0000 mg | ORAL_CAPSULE | Freq: Every day | ORAL | Status: DC
  Administered 2015-12-27 – 2015-12-29 (×3): 300 mg via ORAL
  Filled 2015-12-27 (×5): qty 1

## 2015-12-27 NOTE — Tx Team (Signed)
Interdisciplinary Treatment and Diagnostic Plan Update  12/27/2015 Time of Session: 9:30am Scott Chandler MRN: [id number]  Principal Diagnosis: Alcohol use disorder, severe, dependence ([place])  Secondary Diagnoses: Principal Problem:   Alcohol use disorder, severe, dependence ([place])   Current Medications:  Current Facility-Administered Medications  Medication Dose Route Frequency Provider Last Rate Last Dose  . acetaminophen (TYLENOL) tablet 650 mg  650 mg Oral Q6H PRN Encarnacion Slates, NP   650 mg at 12/26/15 1140  . alum & mag hydroxide-simeth (MAALOX/MYLANTA) 200-200-20 MG/5ML suspension 30 mL  30 mL Oral Q4H PRN Encarnacion Slates, NP      . chlordiazePOXIDE (LIBRIUM) capsule 25 mg  25 mg Oral Q6H PRN Encarnacion Slates, NP      . chlordiazePOXIDE (LIBRIUM) capsule 25 mg  25 mg Oral QID Encarnacion Slates, NP   25 mg at 12/27/15 6712   Followed by  . chlordiazePOXIDE (LIBRIUM) capsule 25 mg  25 mg Oral TID Encarnacion Slates, NP       Followed by  . [START ON 12/28/2015] chlordiazePOXIDE (LIBRIUM) capsule 25 mg  25 mg Oral BH-qamhs Encarnacion Slates, NP       Followed by  . [START ON 12/30/2015] chlordiazePOXIDE (LIBRIUM) capsule 25 mg  25 mg Oral Daily Encarnacion Slates, NP      . DULoxetine (CYMBALTA) DR capsule 20 mg  20 mg Oral Daily Norman Clay, MD   20 mg at 12/27/15 0851  . hydrOXYzine (ATARAX/VISTARIL) tablet 25 mg  25 mg Oral Q6H PRN Encarnacion Slates, NP   25 mg at 12/26/15 1826  . loperamide (IMODIUM) capsule 2-4 mg  2-4 mg Oral PRN Encarnacion Slates, NP      . magnesium hydroxide (MILK OF MAGNESIA) suspension 30 mL  30 mL Oral Daily PRN Encarnacion Slates, NP      . multivitamin with minerals tablet 1 tablet  1 tablet Oral Daily Encarnacion Slates, NP   1 tablet at 12/27/15 0851  . ondansetron (ZOFRAN-ODT) disintegrating tablet 4 mg  4 mg Oral Q6H PRN Encarnacion Slates, NP      . thiamine (VITAMIN B-1) tablet 100 mg  100 mg Oral Daily Encarnacion Slates, NP   100 mg at 12/27/15 4580   PTA Medications: Prescriptions Prior to Admission   Medication Sig Dispense Refill Last Dose  . DULoxetine 40 MG CPEP Take 40 mg by mouth daily. For depression 30 capsule 0 Past Month at Unknown time  . omeprazole (PRILOSEC OTC) 20 MG tablet Take 20 mg by mouth daily.   12/25/2015 at Unknown time  . propranolol (INDERAL) 20 MG tablet Take 1 tablet (20 mg total) by mouth 2 (two) times daily as needed (for anxiety). 60 tablet 0 Past Month at Unknown time  . sertraline (ZOLOFT) 100 MG tablet Take 1 tablet by mouth daily.  3 12/25/2015 at Unknown time  . traZODone (DESYREL) 50 MG tablet Take 1 tablet (50 mg total) by mouth at bedtime as needed for sleep. 30 tablet 0 Past Month at Unknown time    Treatment Modalities: Medication Management, Group therapy, Case management,  1 to 1 session with clinician, Psychoeducation, Recreational therapy.   Physician Treatment Plan for Primary Diagnosis: Alcohol use disorder, severe, dependence ([place]) Long Term Goal(s): Improvement in symptoms so as ready for discharge   Short Term Goals: Ability to identify and develop effective coping behaviors will improve, Compliance with prescribed medications will improve and Ability to identify triggers associated with  substance abuse/mental health issues will improve  Medication Management: Evaluate patient's response, side effects, and tolerance of medication regimen.  Therapeutic Interventions: 1 to 1 sessions, Unit Group sessions and Medication administration.  Evaluation of Outcomes: Not Met  Physician Treatment Plan for Secondary Diagnosis: Principal Problem:   Alcohol use disorder, severe, dependence ([place])  Long Term Goal(s): Improvement in symptoms so as ready for discharge  Short Term Goals: Ability to identify changes in lifestyle to reduce recurrence of condition will improve, Ability to demonstrate self-control will improve and Compliance with prescribed medications will improve  Medication Management: Evaluate patient's response, side effects, and  tolerance of medication regimen.  Therapeutic Interventions: 1 to 1 sessions, Unit Group sessions and Medication administration.  Evaluation of Outcomes: Not Met   RN Treatment Plan for Primary Diagnosis: Alcohol use disorder, severe, dependence ([place]) Long Term Goal(s): Knowledge of disease and therapeutic regimen to maintain health will improve  Short Term Goals: Ability to demonstrate self-control, Ability to identify and develop effective coping behaviors will improve and Compliance with prescribed medications will improve  Medication Management: RN will administer medications as ordered by provider, will assess and evaluate patient's response and provide education to patient for prescribed medication. RN will report any adverse and/or side effects to prescribing provider.  Therapeutic Interventions: 1 on 1 counseling sessions, Psychoeducation, Medication administration, Evaluate responses to treatment, Monitor vital signs and CBGs as ordered, Perform/monitor CIWA, COWS, AIMS and Fall Risk screenings as ordered, Perform wound care treatments as ordered.  Evaluation of Outcomes: Not Met   LCSW Treatment Plan for Primary Diagnosis: Alcohol use disorder, severe, dependence ([place]) Long Term Goal(s): Safe transition to appropriate next level of care at discharge, Engage patient in therapeutic group addressing interpersonal concerns.  Short Term Goals: Engage patient in aftercare planning with referrals and resources, Facilitate patient progression through stages of change regarding substance use diagnoses and concerns and Increase skills for wellness and recovery  Therapeutic Interventions: Assess for all discharge needs, 1 to 1 time with Social worker, Explore available resources and support systems, Assess for adequacy in community support network, Educate family and significant other(s) on suicide prevention, Complete Psychosocial Assessment, Interpersonal group therapy.  Evaluation of  Outcomes: Not Met   Progress in Treatment :  Attending groups: Continuing to assess  Participating in groups: Continuing to assess  Taking medication as prescribed: Yes, MD continuing to assess for appropriate medication regimen  Toleration medication: Yes  Family/Significant other contact made: Treatment team assessing for appropriate contacts  Patient understands diagnosis: Yes  Discussing patient identified problems/goals with staff: Yes  Medical problems stabilized or resolved: Yes  Denies suicidal/homicidal ideation: Treatment team continuing to asses  Issues/concerns per patient self-inventory: None reported  Other: N/A  New problem(s) identified: None reported at this time    New Short Term/Long Term Goal(s): None at this time    Discharge Plan or Barriers: Treatment team continuing to assess.    Reason for Continuation of Hospitalization: Anxiety Depression Medication stabilization Suicidal Ideations Withdrawal symptoms  Estimated Length of Stay: 3-5 days    Attendees:  Patient:   Physician: Dr. Modesta Messing , MD  12/27/2015   9:30am  Nursing: Phillis Haggis., Desma Paganini  12/27/2015 9:30am  RN Care Manager: Lars Pinks, CM  12/27/2015 9:30am  Social Workers: Tilden Fossa, LCSW,  12/27/2015 9:30am  Nurse Pratictioners:   Scribe for Treatment Team: Tilden Fossa, [place] Clinical Social Worker [facility] [phone]

## 2015-12-27 NOTE — [facility] Group Notes (Signed)
Patient said his day was a 4. He do not have a goal. He do not know why he is here. Patient said during his admission he did not discuss a goal and nothing to work on while he's here.

## 2015-12-27 NOTE — Progress Notes (Signed)
D: Pt A & O X3. Presents with flat affect and irritability on initial approach. Denies SI, HI and AVH when assessed. Endorsed anxiety and irritability "I'm just over all that damn conversation in there, I don't wish to listen to that stuff" referring to peers talking in dayroom earlier this shift.  A: Scheduled and PRN medications given as ordered. Emotional support and availability provided to pt. Encouraged to voice concerns / needs. Safety maintained on Q 15 minutes checks without self harm gestures or outburst to note thus far this shift.  R: Pt receptive to care. Attends scheduled groups. Compliant with medication regimen, denies adverse drug reactions. Remains safe on and off unit.

## 2015-12-27 NOTE — Progress Notes (Signed)
[facility] MD Progress Note  12/27/2015 2:28 PM Tal Kempker  MRN:  [id number] Subjective:   Patient seen, chart reviewed and case discussed with nursing staff.   He endorses insomnia last night. He has been feeling irritable as he craves for alcohol and finds Atarax to be helpful. He is interested in residential treatment but is concerned as he has just lost his insurance.   Principal Problem: Alcohol use disorder, severe, dependence ([place]) Diagnosis:   Patient Active Problem List   Diagnosis Date Noted  . Alcohol use disorder, severe, dependence ([place]) [F10.20] 11/18/2015  . 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 EPIGASTRIC PAIN [R10.13] 10/22/2009  . FECAL OCCULT BLOOD [R19.5] 10/22/2009  . HYPERLIPIDEMIA [E78.5] 10/21/2009  . ESOPHAGITIS, HX OF [Z87.19] 10/21/2009  . GERD [K21.9] 05/31/2008  . CHEST PAIN [R07.9] 05/31/2008  . DIARRHEA [R19.7] 05/31/2008   Total Time spent with patient: 20 minutes  Past Psychiatric History: see HPI  Past Medical History:  Past Medical History:  Diagnosis Date  . Chest pain   . Depression   . GERD (gastroesophageal reflux disease)     Past Surgical History:  Procedure Laterality Date  . TRANSTHORACIC ECHOCARDIOGRAM  05/18/2008   normal LVF/ mild LVH/trace mitral and tricuspid regurgitation   Family History:  Family History  Problem Relation Age of Onset  . Hypertension Father   . Cancer Mother    Family Psychiatric  History: see HPI Social History:  History  Alcohol Use  . Yes     History  Drug use: Unknown    Social History   Social History  . Marital status: Married    Spouse name: N/A  . Number of children: N/A  . Years of education: N/A   Social History Main Topics  . Smoking status: Former Research scientist (life sciences)  . Smokeless tobacco: Never Used  . Alcohol use Yes  . Drug use: Unknown  . Sexual activity: Not Asked   Other Topics Concern  . None   Social History Narrative  . None   Additional  Social History:                         Sleep: Fair  Appetite:  Fair  Current Medications: Current Facility-Administered Medications  Medication Dose Route Frequency Provider Last Rate Last Dose  . acetaminophen (TYLENOL) tablet 650 mg  650 mg Oral Q6H PRN Encarnacion Slates, NP   650 mg at 12/27/15 1112  . alum & mag hydroxide-simeth (MAALOX/MYLANTA) 200-200-20 MG/5ML suspension 30 mL  30 mL Oral Q4H PRN Encarnacion Slates, NP      . chlordiazePOXIDE (LIBRIUM) capsule 25 mg  25 mg Oral Q6H PRN Encarnacion Slates, NP      . chlordiazePOXIDE (LIBRIUM) capsule 25 mg  25 mg Oral TID Encarnacion Slates, NP   25 mg at 12/27/15 1218   Followed by  . [START ON 12/28/2015] chlordiazePOXIDE (LIBRIUM) capsule 25 mg  25 mg Oral BH-qamhs Encarnacion Slates, NP       Followed by  . [START ON 12/30/2015] chlordiazePOXIDE (LIBRIUM) capsule 25 mg  25 mg Oral Daily Encarnacion Slates, NP      . DULoxetine (CYMBALTA) DR capsule 20 mg  20 mg Oral Daily Norman Clay, MD   20 mg at 12/27/15 0851  . hydrOXYzine (ATARAX/VISTARIL) tablet 25 mg  25 mg Oral Q6H PRN Encarnacion Slates, NP   25 mg at 12/27/15 1112  . loperamide (  IMODIUM) capsule 2-4 mg  2-4 mg Oral PRN Encarnacion Slates, NP      . magnesium hydroxide (MILK OF MAGNESIA) suspension 30 mL  30 mL Oral Daily PRN Encarnacion Slates, NP      . multivitamin with minerals tablet 1 tablet  1 tablet Oral Daily Encarnacion Slates, NP   1 tablet at 12/27/15 0851  . ondansetron (ZOFRAN-ODT) disintegrating tablet 4 mg  4 mg Oral Q6H PRN Encarnacion Slates, NP      . thiamine (VITAMIN B-1) tablet 100 mg  100 mg Oral Daily Encarnacion Slates, NP   100 mg at 12/27/15 8101    Lab Results:  Results for orders placed or performed during the hospital encounter of 12/26/15 (from the past 48 hour(s))  Rapid urine drug screen (hospital performed)     Status: None   Collection Time: 12/26/15  2:17 AM  Result Value Ref Range   Opiates NONE DETECTED NONE DETECTED   Cocaine NONE DETECTED NONE DETECTED   Benzodiazepines NONE  DETECTED NONE DETECTED   Amphetamines NONE DETECTED NONE DETECTED   Tetrahydrocannabinol NONE DETECTED NONE DETECTED   Barbiturates NONE DETECTED NONE DETECTED    Comment:        DRUG SCREEN FOR MEDICAL PURPOSES ONLY.  IF CONFIRMATION IS NEEDED FOR ANY PURPOSE, NOTIFY LAB WITHIN 5 DAYS.        LOWEST DETECTABLE LIMITS FOR URINE DRUG SCREEN Drug Class       Cutoff (ng/mL) Amphetamine      1000 Barbiturate      200 Benzodiazepine   751 Tricyclics       025 Opiates          300 Cocaine          300 THC              50   Comprehensive metabolic panel     Status: None   Collection Time: 12/26/15  2:30 AM  Result Value Ref Range   Sodium 138 135 - 145 mmol/L   Potassium 3.8 3.5 - 5.1 mmol/L   Chloride 104 101 - 111 mmol/L   CO2 24 22 - 32 mmol/L   Glucose, Bld 97 65 - 99 mg/dL   BUN 15 6 - 20 mg/dL   Creatinine, Ser 0.99 0.61 - 1.24 mg/dL   Calcium 9.4 8.9 - 10.3 mg/dL   Total Protein 7.6 6.5 - 8.1 g/dL   Albumin 4.5 3.5 - 5.0 g/dL   AST 27 15 - 41 U/L   ALT 31 17 - 63 U/L   Alkaline Phosphatase 62 38 - 126 U/L   Total Bilirubin 0.8 0.3 - 1.2 mg/dL   GFR calc non Af Amer >60 >60 mL/min   GFR calc Af Amer >60 >60 mL/min    Comment: (NOTE) The eGFR has been calculated using the CKD EPI equation. This calculation has not been validated in all clinical situations. eGFR's persistently <60 mL/min signify possible Chronic Kidney Disease.    Anion gap 10 5 - 15  Ethanol     Status: Abnormal   Collection Time: 12/26/15  2:30 AM  Result Value Ref Range   Alcohol, Ethyl (B) 147 (H) <5 mg/dL    Comment:        LOWEST DETECTABLE LIMIT FOR SERUM ALCOHOL IS 5 mg/dL FOR MEDICAL PURPOSES ONLY   Salicylate level     Status: None   Collection Time: 12/26/15  2:30 AM  Result Value Ref Range  Salicylate Lvl <1.6 2.8 - 30.0 mg/dL  Acetaminophen level     Status: Abnormal   Collection Time: 12/26/15  2:30 AM  Result Value Ref Range   Acetaminophen (Tylenol), Serum <10 (L) 10 - 30  ug/mL    Comment:        THERAPEUTIC CONCENTRATIONS VARY SIGNIFICANTLY. A RANGE OF 10-30 ug/mL MAY BE AN EFFECTIVE CONCENTRATION FOR MANY PATIENTS. HOWEVER, SOME ARE BEST TREATED AT CONCENTRATIONS OUTSIDE THIS RANGE. ACETAMINOPHEN CONCENTRATIONS >150 ug/mL AT 4 HOURS AFTER INGESTION AND >50 ug/mL AT 12 HOURS AFTER INGESTION ARE OFTEN ASSOCIATED WITH TOXIC REACTIONS.   cbc     Status: Abnormal   Collection Time: 12/26/15  2:30 AM  Result Value Ref Range   WBC 11.3 (H) 4.0 - 10.5 K/uL   RBC 5.43 4.22 - 5.81 MIL/uL   Hemoglobin 16.7 13.0 - 17.0 g/dL   HCT 47.9 39.0 - 52.0 %   MCV 88.2 78.0 - 100.0 fL   MCH 30.8 26.0 - 34.0 pg   MCHC 34.9 30.0 - 36.0 g/dL   RDW 12.9 11.5 - 15.5 %   Platelets 278 150 - 400 K/uL    Blood Alcohol level:  Lab Results  Component Value Date   ETH 147 (H) 12/26/2015   ETH 21 (H) 10/96/0454    Metabolic Disorder Labs: No results found for: HGBA1C, MPG No results found for: PROLACTIN No results found for: CHOL, TRIG, HDL, CHOLHDL, VLDL, LDLCALC  Physical Findings: AIMS: Facial and Oral Movements Muscles of Facial Expression: None, normal Lips and Perioral Area: None, normal Jaw: None, normal Tongue: None, normal,Extremity Movements Upper (arms, wrists, hands, fingers): None, normal Lower (legs, knees, ankles, toes): None, normal, Trunk Movements Neck, shoulders, hips: None, normal, Overall Severity Severity of abnormal movements (highest score from questions above): None, normal Incapacitation due to abnormal movements: None, normal Patient's awareness of abnormal movements (rate only patient's report): No Awareness, Dental Status Current problems with teeth and/or dentures?: No Does patient usually wear dentures?: No  CIWA:  CIWA-Ar Total: 2 COWS:     Musculoskeletal: Strength & Muscle Tone: within normal limits Gait & Station: normal Patient leans: N/A  Psychiatric Specialty Exam: Physical Exam  ROS  Blood pressure 120/78,  pulse 68, temperature 97.9 F (36.6 C), temperature source Oral, resp. rate 18, height _0  (1.778 m), weight 240 lb (108.9 kg), SpO2 98 %.Body mass index is 34.44 kg/m.  General Appearance: Fairly Groomed  Eye Contact:  Good  Speech:  Normal Rate  Volume:  Normal  Mood:  Irritable  Affect:  Restricted  Thought Process:  Coherent and Goal Directed  Orientation:  Full (Time, Place, and Person)  Thought Content:  Logical  Suicidal Thoughts:  No  Homicidal Thoughts:  No  Memory:  intact to recent and remote recall  Judgement:  Fair  Insight:  Fair  Psychomotor Activity:  Normal  Concentration:  Concentration: Fair and Attention Span: Fair  Recall:  [employer] of Knowledge:  Fair  Language:  Fair  Akathisia:  No  Handed:  Right  AIMS (if indicated):     Assets:  Communication Skills Desire for Improvement  ADL's:  Intact  Cognition:  WNL  Sleep:  Number of Hours: 6.75   Assessment Mr. Sylis Ketchum a 40 y.o.malewith alcohol use disorder, chronic pain who was discharged from [facility] in July, presents to the Emergency Department for an evaluation of suicidal ideations with a plan to hang himself in the setting of alcohol use (EtOH 147  on 8/31, UDS negative), discordance with his girlfriend and unemployment.   # MDD He continues to endorse neurovegetative symptoms. Will increase duloxetine to target his mood.   # Alcohol use disorder He is motivated for sobriety and is interested in residential treatment. Will start gabapentin given his craving.   Plans - Increase Duloxetine 40 mg daily - Continue librium protocol - Start gabapentin 300 mg qhs - Medication management to reduce current symptoms to base line and improve the patient's overall level of functioning. - Monitor for the adverse effect of the medications and anger outbursts - Continue 15 minutes observation for safety concerns - Encouraged to participate in milieu therapy and group therapy counseling sessions and also  work with coping skills -  Develop treatment plan to decrease risk of relapse upon discharge and to reduce the need for readmission. -  Psycho-social education regarding relapse prevention and self care. - Health care follow up as needed for medical problems. - Restart home medications where appropriate.   Treatment Plan Summary: Daily contact with patient to assess and evaluate symptoms and progress in treatment  Norman Clay, MD 12/27/2015, 2:28 PM

## 2015-12-27 NOTE — Progress Notes (Signed)
Recreation Therapy Notes  Date: 12/27/15 Time: 0930 Location: 300 Hall Dayroom  Group Topic: Stress Management  Goal Area(s) Addresses:  Patient will verbalize importance of using healthy stress management.  Patient will identify positive emotions associated with healthy stress management.   Behavioral Response: Engaged  Intervention: Stress Management  Activity :  Guided Training and development officermagery Script.  LRT introduced the stress management technique guided imagery.  LRT read script that guided patients in how to perform the technique.  Patients were to follow along as LRT read the script to engage in the technique.  Education: Stress Management, Discharge Planning.   Education Outcome: Acknowledges edcuation/In group clarification offered/Needs additional education  Clinical Observations/Feedback: Pt attended group.   Scott Chandler, LRT/CTRS         Scott RancherLindsay, Scott Chandler 12/27/2015 1:07 PM

## 2015-12-27 NOTE — Plan of Care (Signed)
Problem: Medication: Goal: Compliance with prescribed medication regimen will improve Outcome: Progressing Pt has been medication compliant. Denies adverse drug reactions when assessed.   Problem: Health Behavior/Discharge Planning: Goal: Compliance with therapeutic regimen will improve Pt has been medication compliant. Denies adverse drug reactions when assessed. Attends scheduled groups without issues.

## 2015-12-27 NOTE — [facility] Group Notes (Signed)
[facility] LCSW Group Therapy 12/27/2015 1:15 PM Type of Therapy: Group Therapy Participation Level: Active  Participation Quality: Attentive, Sharing and Supportive  Affect: Depressed and Flat, agitated  Cognitive: Alert and Oriented  Insight: Developing/Improving and Engaged  Engagement in Therapy: Developing/Improving and Engaged  Modes of Intervention: Clarification, Confrontation, Discussion, Education, Exploration, Limit-setting, Orientation, Problem-solving, Rapport Building, Dance movement psychotherapisteality Testing, Socialization and Support  Summary of Progress/Problems: The topic for today was feelings about relapse. Pt discussed what relapse prevention is to them and identified triggers that they are on the path to relapse. Pt processed their feeling towards relapse and was able to relate to peers. Pt discussed coping skills that can be used for relapse prevention. Patient minimally engaged in discussion despite CSW encouragement.    Samuella BruinKristin Arjen Deringer, MSW, LCSW Clinical Social Worker [facility] [phone]

## 2015-12-27 NOTE — Progress Notes (Signed)
   D: Pt was laying down during the assessment. Pt didn't engage the writer in conversation except to answer closed ended questions. Pt has no questions or concerns.   A:  Support and encouragement was offered. 15 min checks continued for safety.  R: Pt remains safe.

## 2015-12-28 NOTE — [facility] Group Notes (Signed)
[facility] LCSW Group Therapy  12/28/2015 10:00 AM  Type of Therapy:  Group Therapy  Participation Level:  Active  Participation Quality:  Appropriate and Attentive  Affect:  Appropriate  Cognitive:  Alert and Oriented  Insight:  Improving  Engagement in Therapy:  Engaged  Modes of Intervention:  Discussion and Education  Summary of Progress/Problems: Group topic was about Managing Change. We discussed the cycle of change from Pre-contemplation to Relapse. Each participant was encouraged to identify their own changes, where they are in the change process and what detailed Preparation/Plan they have to accomplish the change. Reviewed with patients thought/actions based on triggers in order to assist in the development of a plan. Patient was engaged and honestly shared with group the plan to change his drinking.   Beverly Sessionsywan J Raheel Kunkle 12/28/2015, 5:45 PM

## 2015-12-28 NOTE — Progress Notes (Signed)
D: Patient in bed all through. Laying down during assessment. Unwilling to cooperate with this Clinical research associatewriter. Patient stated "I am sleepy" when encouraged to attend the group meeting. Denies pain, SI, AH/VH at this time. Patient sleeping at this time.  A: Staff offered support and encouragement as needed. Every 15 minutes check for safety maintained. Due meds given as ordered. Will continue to monitor patient for safety and stability.  R: Patient remains safe.

## 2015-12-28 NOTE — Progress Notes (Signed)
Patient did attend the evening speaker AA meeting.  

## 2015-12-28 NOTE — Plan of Care (Signed)
Problem: Medication: Goal: Compliance with prescribed medication regimen will improve Outcome: Progressing Patient was able to take all of his medications as prescribed and ask for PRNs when needed.

## 2015-12-28 NOTE — [facility] Group Notes (Signed)
[facility] Group Notes:  (Nursing/MHT/Case Management/Adjunct)  Date:  12/28/2015  Time:  5:57 PM  Type of Therapy:  Psychoeducational Skills  Participation Level:  Minimal  Participation Quality:  Inattentive, Redirectable and Resistant  Affect:  Resistant  Cognitive:  Alert and Oriented  Insight:  Lacking  Engagement in Group:  Limited and Resistant  Modes of Intervention:  Discussion and Exploration  Summary of Progress/Problems:Attended group identifying dangerous situations and new ways to cope with them. Patient stated that he drinks every day, cannot imagine not drinking, really has no desire to stop drinking. Stated that drinking has not had a significantly bad impact on his life. When asked why he was here stated it was because his girlfriend kicked him out and his wife refused to take him back.  Vinetta BergamoBarbara M Ryen Rhames 12/28/2015, 5:57 PM

## 2015-12-28 NOTE — Progress Notes (Signed)
Data. Patient denies SI/HI/AVH. Patient is able to verbally contract to stay safe on the unit and to come to staff if his thoughts/feelings become overwhelming, prior to acting on them. Patient states that he has, "No desire to stop drinking and life without it would be boring". He has attended all the groups. Patient has been calm and appropriate throughout shift. Patient interacting well with staff and other patients. On his self inventory he reports 3/10 for depression, 2/10 hopelessness and 7/10 anxiety. His goal for today is, "Concentration returning". Action. Emotional support and encouragement offered. Education provided on medication, indications and side effect. Q 15 minute checks done for safety. Response. Safety on the unit maintained through 15 minute checks.  Medications taken as prescribed. Attended groups. Remained calm and appropriate through out shift.

## 2015-12-28 NOTE — Progress Notes (Signed)
Patient ID: Scott Chandler, male   DOB: 06-23-1975, 40 y.o.   MRN: [id number] [facility] MD Progress Note  12/28/2015 3:55 PM Meshilem Machuca  MRN:  [id number]  Subjective: Bright reports, "I think I'm starting to feel a little better. The Neurontin seem to have helped & will probably work well for me. I'm still interested in going to a treatment center, however, would want to go home first, take care of some business, then go to the treatment center recommended".  Objective: Patient seen, chart reviewed and case discussed with nursing staff. He says his insomnia is improving. He has been feeling irritable as he craves for alcohol and finds Atarax to be helpful. He is interested in residential treatment but is concerned as he has just lost his insurance. He says he would want to go home first, take care of some business, then go to rehab center.  Principal Problem: Alcohol use disorder, severe, dependence (HCC) Diagnosis:   Patient Active Problem List   Diagnosis Date Noted  . Alcohol use disorder, severe, dependence (HCC) [F10.20] 11/18/2015  . Alcohol abuse with alcohol-induced mood disorder (HCC) [F10.14] 11/14/2015  . EPIGASTRIC PAIN [R10.13] 10/22/2009  . FECAL OCCULT BLOOD [R19.5] 10/22/2009  . HYPERLIPIDEMIA [E78.5] 10/21/2009  . ESOPHAGITIS, HX OF [Z87.19] 10/21/2009  . GERD [K21.9] 05/31/2008  . CHEST PAIN [R07.9] 05/31/2008  . DIARRHEA [R19.7] 05/31/2008   Total Time spent with patient: 15 minutes  Past Psychiatric History: See HPI  Past Medical History:  Past Medical History:  Diagnosis Date  . Chest pain   . Depression   . GERD (gastroesophageal reflux disease)     Past Surgical History:  Procedure Laterality Date  . TRANSTHORACIC ECHOCARDIOGRAM  05/18/2008   normal LVF/ mild LVH/trace mitral and tricuspid regurgitation   Family History:  Family History  Problem Relation Age of Onset  . Hypertension Father   . Cancer Mother    Family Psychiatric  History: See HPI  Social  History:  History  Alcohol Use  . Yes     History  Drug use: Unknown    Social History   Social History  . Marital status: Married    Spouse name: N/A  . Number of children: N/A  . Years of education: N/A   Social History Main Topics  . Smoking status: Former Games developer  . Smokeless tobacco: Never Used  . Alcohol use Yes  . Drug use: Unknown  . Sexual activity: Not Asked   Other Topics Concern  . None   Social History Narrative  . None   Additional Social History:   Sleep: Fair  Appetite:  Fair  Current Medications: Current Facility-Administered Medications  Medication Dose Route Frequency Provider Last Rate Last Dose  . acetaminophen (TYLENOL) tablet 650 mg  650 mg Oral Q6H PRN Sanjuana Kava, NP   650 mg at 12/27/15 1726  . alum & mag hydroxide-simeth (MAALOX/MYLANTA) 200-200-20 MG/5ML suspension 30 mL  30 mL Oral Q4H PRN Sanjuana Kava, NP      . chlordiazePOXIDE (LIBRIUM) capsule 25 mg  25 mg Oral Q6H PRN Sanjuana Kava, NP      . chlordiazePOXIDE (LIBRIUM) capsule 25 mg  25 mg Oral BH-qamhs Sanjuana Kava, NP       Followed by  . [START ON 12/30/2015] chlordiazePOXIDE (LIBRIUM) capsule 25 mg  25 mg Oral Daily Sanjuana Kava, NP      . DULoxetine (CYMBALTA) DR capsule 40 mg  40 mg Oral Daily Neysa Hotter, MD  40 mg at 12/28/15 0807  . gabapentin (NEURONTIN) capsule 300 mg  300 mg Oral QHS Neysa Hotter, MD   300 mg at 12/27/15 2113  . hydrOXYzine (ATARAX/VISTARIL) tablet 25 mg  25 mg Oral Q6H PRN Sanjuana Kava, NP   25 mg at 12/28/15 1151  . loperamide (IMODIUM) capsule 2-4 mg  2-4 mg Oral PRN Sanjuana Kava, NP      . magnesium hydroxide (MILK OF MAGNESIA) suspension 30 mL  30 mL Oral Daily PRN Sanjuana Kava, NP      . multivitamin with minerals tablet 1 tablet  1 tablet Oral Daily Sanjuana Kava, NP   1 tablet at 12/28/15 [phone]  . ondansetron (ZOFRAN-ODT) disintegrating tablet 4 mg  4 mg Oral Q6H PRN Sanjuana Kava, NP      . thiamine (VITAMIN B-1) tablet 100 mg  100 mg Oral  Daily Sanjuana Kava, NP   100 mg at 12/28/15 0808   Lab Results:  No results found for this or any previous visit (from the past 48 hour(s)).  Blood Alcohol level:  Lab Results  Component Value Date   ETH 147 (H) 12/26/2015   ETH 21 (H) 11/14/2015   Metabolic Disorder Labs: No results found for: HGBA1C, MPG No results found for: PROLACTIN No results found for: CHOL, TRIG, HDL, CHOLHDL, VLDL, LDLCALC  Physical Findings: AIMS: Facial and Oral Movements Muscles of Facial Expression: None, normal Lips and Perioral Area: None, normal Jaw: None, normal Tongue: None, normal,Extremity Movements Upper (arms, wrists, hands, fingers): None, normal Lower (legs, knees, ankles, toes): None, normal, Trunk Movements Neck, shoulders, hips: None, normal, Overall Severity Severity of abnormal movements (highest score from questions above): None, normal Incapacitation due to abnormal movements: None, normal Patient's awareness of abnormal movements (rate only patient's report): No Awareness, Dental Status Current problems with teeth and/or dentures?: No Does patient usually wear dentures?: No  CIWA:  CIWA-Ar Total: 0 COWS:     Musculoskeletal: Strength & Muscle Tone: within normal limits Gait & Station: normal Patient leans: N/A  Psychiatric Specialty Exam: Physical Exam  Constitutional: He appears well-developed.  HENT:  Head: Normocephalic.  Eyes: Pupils are equal, round, and reactive to light.  Neck: Normal range of motion.  Cardiovascular: Normal rate.   Respiratory: Effort normal.  GI: Soft.  Musculoskeletal: Normal range of motion.  Neurological: He is alert.  Skin: Skin is warm.    Review of Systems  Constitutional: Positive for malaise/fatigue.  HENT: Negative.   Eyes: Negative.   Respiratory: Negative.   Cardiovascular: Negative.   Gastrointestinal: Negative.   Genitourinary: Negative.   Skin: Negative.   Neurological: Negative.   Endo/Heme/Allergies: Negative.    Psychiatric/Behavioral: Positive for depression ("Improving") and substance abuse (Hx. Alcoholism). Negative for hallucinations, memory loss and suicidal ideas. The patient is nervous/anxious ("Improving") and has insomnia ("Improving").     Blood pressure 129/89, pulse 66, temperature 97.6 F (36.4 C), temperature source Oral, resp. rate 18, height 5\' 10"  (1.778 m), weight 108.9 kg (240 lb), SpO2 98 %.Body mass index is 34.44 kg/m.  General Appearance: Fairly Groomed  Eye Contact:  Good  Speech:  Normal Rate  Volume:  Normal  Mood:  Irritable  Affect:  Restricted  Thought Process:  Coherent and Goal Directed  Orientation:  Full (Time, Place, and Person)  Thought Content:  Logical  Suicidal Thoughts:  No  Homicidal Thoughts:  No  Memory:  intact to recent and remote recall  Judgement:  Fair  Insight:  Fair  Psychomotor Activity:  Normal  Concentration:  Concentration: Fair and Attention Span: Fair  Recall:  FiservFair  Fund of Knowledge:  Fair  Language:  Fair  Akathisia:  No  Handed:  Right  AIMS (if indicated):     Assets:  Communication Skills Desire for Improvement  ADL's:  Intact  Cognition:  WNL  Sleep:  Number of Hours: 6.75   Assessment Mr. Arman FilterJason Greeris a 40 y.o.malewith alcohol use disorder, chronic pain who [facility], presents to the Emergency Department for an evaluation of suicidal ideations with a plan to hang himself in the setting of alcohol use (EtOH 147 on 8/31, UDS negative), discordance with his girlfriend and unemployment.   # MDD He continues to endorse neurovegetative symptoms. Will increase duloxetine to target his mood.   # Alcohol use disorder He is motivated for sobriety and is interested in residential treatment. Will start gabapentin given his craving.   Plans - Continue Duloxetine 40 mg daily - Continue librium protocol - Continue gabapentin 300 mg qhs - Medication management to reduce current symptoms to base line and  improve the patient's overall level of functioning. - Monitor for the adverse effect of the medications and anger outbursts - Continue 15 minutes observation for safety concerns - Encouraged to participate in milieu therapy and group therapy counseling sessions and also work with coping skills -  Develop treatment plan to decrease risk of relapse upon discharge and to reduce the need for readmission. -  Psycho-social education regarding relapse prevention and self care. - Health care follow up as needed for medical problems. - Restart home medications where appropriate. Treatment Plan Summary: Daily contact with patient to assess and evaluate symptoms and progress in treatment  Sanjuana KavaNwoko, Kimela Malstrom I, NP, PMHNP, FNP-BC 12/28/2015, 3:55 PM

## 2015-12-29 MED ORDER — NALTREXONE HCL 50 MG PO TABS
25.0000 mg | ORAL_TABLET | Freq: Every day | ORAL | Status: DC
  Administered 2015-12-29 – 2015-12-30 (×2): 25 mg via ORAL
  Filled 2015-12-29 (×4): qty 1

## 2015-12-29 MED ORDER — HYDROXYZINE HCL 25 MG PO TABS
25.0000 mg | ORAL_TABLET | Freq: Four times a day (QID) | ORAL | Status: DC | PRN
  Administered 2015-12-29: 25 mg via ORAL
  Filled 2015-12-29: qty 10

## 2015-12-29 NOTE — [facility] Group Notes (Signed)
[facility] LCSW Group Therapy  12/29/2015 10:00 AM  Type of Therapy:  Group Therapy  Participation Level:  Minimal  Participation Quality:  Appropriate and Attentive  Affect:  Appropriate  Cognitive:  Appropriate  Insight:  Improving  Engagement in Therapy:  Engaged  Modes of Intervention:  Discussion  Summary of Progress/Problems:Group today focused on processing 5 P's. Passion, Power, Problem, Plan and Partners. This exercise encourages participants to work through plan for things they desire to do and pulling together the resources and supports to help them work through it. Participants will have the opportunity to develop these ideas as they leave. Key therapeutic elements include goal setting, developing coping skills and expanding supports. Patient was able to identify that both his power and passion were connected to his career and that supports his drive and success.   Scott Chandler 12/29/2015, 12:35 PM

## 2015-12-29 NOTE — [facility] Group Notes (Signed)
Nursing psychoeducational group focussed on mindfulness meditation/body scanning technique with guided meditation.   Pt attended and participated in group.

## 2015-12-29 NOTE — Progress Notes (Signed)
Patient ID: Scott Chandler, male   DOB: March 01, 1976, 40 y.o.   MRN: [id number] Patient ID: Scott Chandler, male   DOB: March 01, 1976, 40 y.o.   MR[facility]: [id number] [facility]  12/29/2015 1:59 PM Scott Chandler  MRN:  [id number]  Subjective: Scott Chandler reports, "I'm feeling a lot better physically. The withdrawal symptoms are getting a lot easier. However, I can't seem to not crave alcohol. That is my weakness. The cravings always will get me. I have tried Naltrexone 50 mg in the past, made me sick. I'm tempted to give another try to see if I will be able to tolerate it this time".   Objective: Patient seen, chart reviewed and case discussed with nursing staff. He says his insomnia is improving. He has been feeling some irritability as he craves alcohol. He still find Atarax to be helpful. He is interested in residential treatment but is concerned as he has just lost his insurance. He says he would want to go home first, take care of some business, then go to rehab center. Has agreed to try Naltrexone on a lose dose.  Principal Problem: Alcohol use disorder, severe, dependence (HCC) Diagnosis:   Patient Active Problem List   Diagnosis Date Noted  . Alcohol use disorder, severe, dependence (HCC) [F10.20] 11/18/2015  . Alcohol abuse with alcohol-induced mood disorder (HCC) [F10.14] 11/14/2015  . EPIGASTRIC PAIN [R10.13] 10/22/2009  . FECAL OCCULT BLOOD [R19.5] 10/22/2009  . HYPERLIPIDEMIA [E78.5] 10/21/2009  . ESOPHAGITIS, HX OF [Z87.19] 10/21/2009  . GERD [K21.9] 05/31/2008  . CHEST PAIN [R07.9] 05/31/2008  . DIARRHEA [R19.7] 05/31/2008   Total Time spent with patient: 15 minutes  Past Psychiatric History: See HPI  Past Medical History:  Past Medical History:  Diagnosis Date  . Chest pain   . Depression   . GERD (gastroesophageal reflux disease)     Past Surgical History:  Procedure Laterality Date  . TRANSTHORACIC ECHOCARDIOGRAM  05/18/2008   normal LVF/ mild LVH/trace mitral and tricuspid  regurgitation   Family History:  Family History  Problem Relation Age of Onset  . Hypertension Father   . Cancer Mother    Family Psychiatric  History: See HPI  Social History:  History  Alcohol Use  . Yes     History  Drug use: Unknown    Social History   Social History  . Marital status: Married    Spouse name: N/A  . Number of children: N/A  . Years of education: N/A   Social History Main Topics  . Smoking status: Former Games developermoker  . Smokeless tobacco: Never Used  . Alcohol use Yes  . Drug use: Unknown  . Sexual activity: Not Asked   Other Topics Concern  . None   Social History Narrative  . None   Additional Social History:   Sleep: Good  Appetite:  Good  Current Medications: Current Facility-Administered Medications  Medication Dose Route Frequency Provider Last Rate Last Dose  . acetaminophen (TYLENOL) tablet 650 mg  650 mg Oral Q6H PRN Sanjuana KavaAgnes I Ammaar Encina, NP   650 mg at 12/27/15 1726  . alum & mag hydroxide-simeth (MAALOX/MYLANTA) 200-200-20 MG/5ML suspension 30 mL  30 mL Oral Q4H PRN Sanjuana KavaAgnes I Glennda Weatherholtz, NP      . Melene Muller[START ON 12/30/2015] chlordiazePOXIDE (LIBRIUM) capsule 25 mg  25 mg Oral Daily Sanjuana KavaAgnes I Kindal Ponti, NP      . DULoxetine (CYMBALTA) DR capsule 40 mg  40 mg Oral Daily Neysa Hottereina Hisada, MD   40 mg at 12/29/15 0800  .  gabapentin (NEURONTIN) capsule 300 mg  300 mg Oral QHS Neysa Hotter, MD   300 mg at 12/28/15 2140  . magnesium hydroxide (MILK OF MAGNESIA) suspension 30 mL  30 mL Oral Daily PRN Sanjuana Kava, NP      . multivitamin with minerals tablet 1 tablet  1 tablet Oral Daily Sanjuana Kava, NP   1 tablet at 12/29/15 0800  . naltrexone (DEPADE) tablet 25 mg  25 mg Oral Daily Sanjuana Kava, NP   25 mg at 12/29/15 1152  . thiamine (VITAMIN B-1) tablet 100 mg  100 mg Oral Daily Sanjuana Kava, NP   100 mg at 12/29/15 0800   Lab Results:  No results found for this or any previous visit (from the past 48 hour(s)).  Blood Alcohol level:  Lab Results  Component  Value Date   ETH 147 (H) 12/26/2015   ETH 21 (H) 11/14/2015   Metabolic Disorder Labs: No results found for: HGBA1C, MPG No results found for: PROLACTIN No results found for: CHOL, TRIG, HDL, CHOLHDL, VLDL, LDLCALC  Physical Findings: AIMS: Facial and Oral Movements Muscles of Facial Expression: None, normal Lips and Perioral Area: None, normal Jaw: None, normal Tongue: None, normal,Extremity Movements Upper (arms, wrists, hands, fingers): None, normal Lower (legs, knees, ankles, toes): None, normal, Trunk Movements Neck, shoulders, hips: None, normal, Overall Severity Severity of abnormal movements (highest score from questions above): None, normal Incapacitation due to abnormal movements: None, normal Patient's awareness of abnormal movements (rate only patient's report): No Awareness, Dental Status Current problems with teeth and/or dentures?: No Does patient usually wear dentures?: No  CIWA:  CIWA-Ar Total: 1 COWS:     Musculoskeletal: Strength & Muscle Tone: within normal limits Gait & Station: normal Patient leans: N/A  Psychiatric Specialty Exam: Physical Exam  Constitutional: He appears well-developed.  HENT:  Head: Normocephalic.  Eyes: Pupils are equal, round, and reactive to light.  Neck: Normal range of motion.  Cardiovascular: Normal rate.   Respiratory: Effort normal.  GI: Soft.  Musculoskeletal: Normal range of motion.  Neurological: He is alert.  Skin: Skin is warm.    Review of Systems  Constitutional: Positive for malaise/fatigue.  HENT: Negative.   Eyes: Negative.   Respiratory: Negative.   Cardiovascular: Negative.   Gastrointestinal: Negative.   Genitourinary: Negative.   Skin: Negative.   Neurological: Negative.   Endo/Heme/Allergies: Negative.   Psychiatric/Behavioral: Positive for depression ("Improving") and substance abuse (Hx. Alcoholism). Negative for hallucinations, memory loss and suicidal ideas. The patient is nervous/anxious  ("Improving") and has insomnia ("Improving").     Blood pressure 133/90, pulse 94, temperature 98.6 F (37 C), resp. rate 16, height 5\' 10"  (1.778 m), weight 108.9 kg (240 lb), SpO2 98 %.Body mass index is 34.44 kg/m.  General Appearance: Fairly Groomed  Eye Contact:  Good  Speech:  Normal Rate  Volume:  Normal  Mood:  Irritable  Affect:  Restricted  Thought Process:  Coherent and Goal Directed  Orientation:  Full (Time, Place, and Person)  Thought Content:  Logical  Suicidal Thoughts:  No  Homicidal Thoughts:  No  Memory:  intact to recent and remote recall  Judgement:  Fair  Insight:  Fair  Psychomotor Activity:  Normal  Concentration:  Concentration: Fair and Attention Span: Fair  Recall:  [employer] of Knowledge:  Fair  Language:  Fair  Akathisia:  No  Handed:  Right  AIMS (if indicated):     Assets:  Communication Skills Desire for  Improvement  ADL's:  Intact  Cognition:  WNL  Sleep:  Number of Hours: 6   Assessment Mr. Paige Vanderwoude a 40 y.o.malewith alcohol use disorder, chronic pain who was discharged from [facility] in July, presents to the Emergency Department for an evaluation of suicidal ideations with a plan to hang himself in the setting of alcohol use (EtOH 147 on 8/31, UDS negative), discordance with his girlfriend and unemployment.   # MDD He continues to endorse neurovegetative symptoms. Will increase duloxetine to target his mood.   # Alcohol use disorder He is motivated for sobriety and is interested in residential treatment. Will start gabapentin given his craving.   Plans - Continue Duloxetine 40 mg daily for depression - Continue librium protocol for alcohol detox. - Continue gabapentin 300 mg q hs for agitation. - Initiate Naltrexone 25 mg daily for alcohol cravings. - Medication management to reduce current symptoms to base line and improve the patient's overall level of functioning. - Monitor for the adverse effect of the medications and anger  outbursts - Continue 15 minutes observation for safety concerns - Encouraged to participate in milieu therapy and group therapy counseling sessions and also work with coping skills -  Develop treatment plan to decrease risk of relapse upon discharge and to reduce the need for readmission. -  Psycho-social education regarding relapse prevention and self care. - Health care follow up as needed for medical problems. - Restart home medications where appropriate. Treatment Plan Summary: Daily contact with patient to assess and evaluate symptoms and progress in treatment  Sanjuana Kava, NP, PMHNP, FNP-BC 12/29/2015, 1:59 PM

## 2015-12-29 NOTE — Progress Notes (Signed)
D: Patient was seen pacing the hall way. Patient stated "I am just getting myself ready to sleep. Denies pain, SI, AH/VH at this time.  No behavioral issues noted. Patient made no new complaint.  A: Staff offered support and encouragement. Due meds given as ordered. Every 15 minutes check for safety maintained. Will continue to monitor patient for safety. R: Patient receptive to care.

## 2015-12-29 NOTE — Progress Notes (Signed)
D: Pt presents with flat and anxious mood. Pt reports withdrawal symptoms of cravings and irritability this morning. Pt started of Naltrexone for cravings. Pt reports decreased depression and anxiety today. Pt denies suicidal thoughts. Pt seeking long term tx for substance abuse. Pt compliant with tx. No side effects to meds verbalized by pt.  A: Medications reviewed with pt. Medications administered as ordered per MD. Verbal support provided. Pt encouraged to attend groups. 15 minute checks performed for safety.  R: Pt receptive to tx. Pt verbalizes understanding of med regimen.

## 2015-12-30 DIAGNOSIS — F1020 Alcohol dependence, uncomplicated: Secondary | ICD-10-CM

## 2015-12-30 MED ORDER — HYDROXYZINE HCL 25 MG PO TABS: 25.0000 mg | ORAL_TABLET | Freq: Four times a day (QID) | ORAL | 0 refills | Status: DC | PRN

## 2015-12-30 MED ORDER — GABAPENTIN 300 MG PO CAPS: 300.0000 mg | ORAL_CAPSULE | Freq: Every day | ORAL | 0 refills | Status: DC

## 2015-12-30 MED ORDER — NALTREXONE HCL 50 MG PO TABS: 25.0000 mg | ORAL_TABLET | Freq: Every day | ORAL | 0 refills | Status: DC

## 2015-12-30 MED ORDER — DULOXETINE HCL 40 MG PO CPEP: 40.0000 mg | ORAL_CAPSULE | Freq: Every day | ORAL | 0 refills | Status: DC

## 2015-12-30 NOTE — Progress Notes (Addendum)
  [facility] :  Will you be returning to the same living situation after discharge:  No. Patient plans to stay with wife while awaiting [facility] bed At discharge, do you have transportation home?: Yes,  patient reports access to transportation Do you have the ability to pay for your medications: Yes,  patient will be provided with prescriptions at discharge  Release of information consent forms completed and in the chart;  Patient's signature needed at discharge.  Patient to Follow up at: Follow-up Information    [facility] .   Why:  Please call Shayla today to complete phone interview for possible admission for tomorrow 12/31/15. Referral made on 12/27/15. Contact information: [address] [place]  [phone]          Next level of care provider [facility]:no  Safety Planning and Suicide Prevention discussed: Yes,  with patient and wife  Have you used any form of tobacco in the last 30 days? (Cigarettes, Smokeless Tobacco, Cigars, and/or Pipes): No  Has patient been referred to the Quitline?: N/A patient is not a smoker  Patient has been referred for addiction treatment: Yes  Jillaine Waren L Reagan Klemz 12/30/2015, 10:17 AM

## 2015-12-30 NOTE — Progress Notes (Signed)
Patient discharged per physician order; patient denies SI/HI and A/V hallucinations; patient received prescriptions, samples, AVS, copy of the suicide safety plan, suicide risk assessment note, and transition record given to the patient after it was reviewed; patient had no other questions or concerns at this time; patient verbalized and signed that all belongings were returned; patient left the unit ambulatory 

## 2015-12-30 NOTE — Progress Notes (Signed)
Patient did attend the evening speaker AA meeting.  

## 2015-12-30 NOTE — Progress Notes (Signed)
Scott Chandler. Scott Chandler had been up and visible in milieu this evening, did attend and participate in evening group activity. [name] spoke about how he is feeling better but did endorse anxiety and irritability and craving. [name] did receive medications without incident and did not verbalize any complaints of pain. A. Support and encouragement provided. R. Safety maintained, will continue to monitor.

## 2015-12-30 NOTE — Progress Notes (Signed)
Recreation Therapy Notes  Date: 12/30/15 Time: 0930 Location: 300 Hall Group Room  Group Topic: Stress Management  Goal Area(s) Addresses:  Patient will verbalize importance of using healthy stress management.  Patient will identify positive emotions associated with healthy stress management.   Intervention: Stress Management  Activity :  Guided Imagery.  LRT introduced the stress management technique of guided imagery to the patients.  LRT read a script so patients could participate in the activity.  Patients were to follow along as LRT read script to participate.  Education:  Stress Management, Discharge Planning.   Education Outcome: Needs additional education  Clinical Observations/Feedback: Pt did not attend group.   Caroll RancherMarjette Lacora Folmer, LRT/CTRS         Caroll RancherLindsay, Markelle Asaro A 12/30/2015 12:23 PM

## 2015-12-30 NOTE — Tx Team (Signed)
Interdisciplinary Treatment and Diagnostic Plan Update  12/30/2015 Time of Session: 9:30am Scott ComberJason Chandler MRN: [id number]  Principal Diagnosis: Alcohol use disorder, severe, dependence (HCC)  Secondary Diagnoses: Principal Problem:   Alcohol use disorder, severe, dependence (HCC) Active Problems:   Alcohol abuse with alcohol-induced mood disorder (HCC)   Current Medications:  Current Facility-Administered Medications  Medication Dose Route Frequency Provider Last Rate Last Dose  . acetaminophen (TYLENOL) tablet 650 mg  650 mg Oral Q6H PRN Sanjuana KavaAgnes I Nwoko, NP   650 mg at 12/27/15 1726  . alum & mag hydroxide-simeth (MAALOX/MYLANTA) 200-200-20 MG/5ML suspension 30 mL  30 mL Oral Q4H PRN Sanjuana KavaAgnes I Nwoko, NP      . DULoxetine (CYMBALTA) DR capsule 40 mg  40 mg Oral Daily Neysa Hottereina Hisada, MD   40 mg at 12/30/15 0825  . gabapentin (NEURONTIN) capsule 300 mg  300 mg Oral QHS Neysa Hottereina Hisada, MD   300 mg at 12/29/15 2145  . hydrOXYzine (ATARAX/VISTARIL) tablet 25 mg  25 mg Oral Q6H PRN Oneta Rackanika N Lewis, NP   25 mg at 12/29/15 1724  . magnesium hydroxide (MILK OF MAGNESIA) suspension 30 mL  30 mL Oral Daily PRN Sanjuana KavaAgnes I Nwoko, NP      . multivitamin with minerals tablet 1 tablet  1 tablet Oral Daily Sanjuana KavaAgnes I Nwoko, NP   1 tablet at 12/30/15 0825  . naltrexone (DEPADE) tablet 25 mg  25 mg Oral Daily Sanjuana KavaAgnes I Nwoko, NP   25 mg at 12/30/15 0825  . thiamine (VITAMIN B-1) tablet 100 mg  100 mg Oral Daily Sanjuana KavaAgnes I Nwoko, NP   100 mg at 12/30/15 0825   PTA Medications: Prescriptions Prior to Admission  Medication Sig Dispense Refill Last Dose  . DULoxetine 40 MG CPEP Take 40 mg by mouth daily. For depression 30 capsule 0 Past Month at Unknown time  . omeprazole (PRILOSEC OTC) 20 MG tablet Take 20 mg by mouth daily.   12/25/2015 at Unknown time  . propranolol (INDERAL) 20 MG tablet Take 1 tablet (20 mg total) by mouth 2 (two) times daily as needed (for anxiety). 60 tablet 0 Past Month at Unknown time  . sertraline  (ZOLOFT) 100 MG tablet Take 1 tablet by mouth daily.  3 12/25/2015 at Unknown time  . traZODone (DESYREL) 50 MG tablet Take 1 tablet (50 mg total) by mouth at bedtime as needed for sleep. 30 tablet 0 Past Month at Unknown time    Treatment Modalities: Medication Management, Group therapy, Case management,  1 to 1 session with clinician, Psychoeducation, Recreational therapy.   Physician Treatment Plan for Primary Diagnosis: Alcohol use disorder, severe, dependence (HCC) Long Term Goal(s): Improvement in symptoms so as ready for discharge   Short Term Goals: Ability to identify and develop effective coping behaviors will improve, Compliance with prescribed medications will improve and Ability to identify triggers associated with substance abuse/mental health issues will improve  Medication Management: Evaluate patient's response, side effects, and tolerance of medication regimen.  Therapeutic Interventions: 1 to 1 sessions, Unit Group sessions and Medication administration.  Evaluation of Outcomes: Adequate for Discharge  Physician Treatment Plan for Secondary Diagnosis: Principal Problem:   Alcohol use disorder, severe, dependence (HCC) Active Problems:   Alcohol abuse with alcohol-induced mood disorder (HCC)  Long Term Goal(s): Improvement in symptoms so as ready for discharge  Short Term Goals: Ability to identify changes in lifestyle to reduce recurrence of condition will improve, Ability to demonstrate self-control will improve and Compliance with prescribed medications will  improve  Medication Management: Evaluate patient's response, side effects, and tolerance of medication regimen.  Therapeutic Interventions: 1 to 1 sessions, Unit Group sessions and Medication administration.  Evaluation of Outcomes: Adequate for Discharge   RN Treatment Plan for Primary Diagnosis: Alcohol use disorder, severe, dependence (HCC) Long Term Goal(s): Knowledge of disease and therapeutic regimen  to maintain health will improve  Short Term Goals: Ability to demonstrate self-control, Ability to identify and develop effective coping behaviors will improve and Compliance with prescribed medications will improve  Medication Management: RN will administer medications as ordered by provider, will assess and evaluate patient's response and provide education to patient for prescribed medication. RN will report any adverse and/or side effects to prescribing provider.  Therapeutic Interventions: 1 on 1 counseling sessions, Psychoeducation, Medication administration, Evaluate responses to treatment, Monitor vital signs and CBGs as ordered, Perform/monitor CIWA, COWS, AIMS and Fall Risk screenings as ordered, Perform wound care treatments as ordered.  Evaluation of Outcomes: Adequate for Discharge   LCSW Treatment Plan for Primary Diagnosis: Alcohol use disorder, severe, dependence (HCC) Long Term Goal(s): Safe transition to appropriate next level of care at discharge, Engage patient in therapeutic group addressing interpersonal concerns.  Short Term Goals: Engage patient in aftercare planning with referrals and resources, Facilitate patient progression through stages of change regarding substance use diagnoses and concerns and Increase skills for wellness and recovery  Therapeutic Interventions: Assess for all discharge needs, 1 to 1 time with Social worker, Explore available resources and support systems, Assess for adequacy in community support network, Educate family and significant other(s) on suicide prevention, Complete Psychosocial Assessment, Interpersonal group therapy.  Evaluation of Outcomes: Adequate for Discharge   Progress in Treatment :  Attending groups: Yes  Participating in groups: Yes  Taking medication as prescribed: Yes, MD continuing to assess for appropriate medication regimen  Toleration medication: Yes  Family/Significant other contact made: Treatment team  assessing for appropriate contacts  Patient understands diagnosis: Yes  Discussing patient identified problems/goals with staff: Yes  Medical problems stabilized or resolved: Yes  Denies suicidal/homicidal ideation: Yes, Patient denies  Issues/concerns per patient self-inventory: None reported  Other: N/A  New problem(s) identified: None reported at this time    New Short Term/Long Term Goal(s): None at this time    Discharge Plan or Barriers: Patient plans to return home with wife to follow up with [facility]. Referral pending as of 9/1.    Reason for Continuation of Hospitalization: Anxiety Depression Medication stabilization Suicidal Ideations Withdrawal symptoms  Estimated Length of Stay: Discharge anticipated for today 12/30/15    Attendees:  Attendees:  Patient:                        Physician: Dr. Elna Breslow , MD  12/30/2015   9:30am  Nursing: Joslyn Devon, Leighton Parody, Christa Jeris Penta  12/30/2015 9:30am  RN Care Manager:   Social Workers: Samuella Bruin, [place],  Rod [place] LCSW 12/30/2015 9:30am   Scribe for Treatment Team: Samuella Bruin, LCSW Clinical Social Worker [facility] [phone]

## 2015-12-30 NOTE — Discharge Summary (Signed)
Physician Discharge Summary Note  Patient:  Scott Chandler is an 40 y.o., male MRN:  [id number] DOB:  1976-04-12 Patient phone:  [phone] (home)  Patient address:   [address] [place] [address],  Total Time spent with patient: Greater than 30 minutes  Date of Admission:  12/26/2015  Date of Discharge: 12-30-15  Reason for Admission: Alcohol intoxication  Principal Problem: Alcohol use disorder, severe, dependence [facility])  Discharge Diagnoses: Patient Active Problem List   Diagnosis Date Noted  . Alcohol use disorder, severe, dependence (HCC) [F10.20] 11/18/2015  . Alcohol abuse with alcohol-induced mood disorder (HCC) [F10.14] 11/14/2015  . EPIGASTRIC PAIN [R10.13] 10/22/2009  . FECAL OCCULT BLOOD [R19.5] 10/22/2009  . HYPERLIPIDEMIA [E78.5] 10/21/2009  . ESOPHAGITIS, HX OF [Z87.19] 10/21/2009  . GERD [K21.9] 05/31/2008  . CHEST PAIN [R07.9] 05/31/2008  . DIARRHEA [R19.7] 05/31/2008   Past Psychiatric History: Alcoholism, chronic.  Past Medical History:  Past Medical History:  Diagnosis Date  . Chest pain   . Depression   . GERD (gastroesophageal reflux disease)     Past Surgical History:  Procedure Laterality Date  . TRANSTHORACIC ECHOCARDIOGRAM  05/18/2008   normal LVF/ mild LVH/trace mitral and tricuspid regurgitation   Family History:  Family History  Problem Relation Age of Onset  . Hypertension Father   . Cancer Mother    Family Psychiatric  History: See H&P  Social History:  History  Alcohol Use  . Yes     History  Drug use: Unknown    Social History   Social History  . Marital status: Married    Spouse name: N/A  . Number of children: N/A  . Years of education: N/A   Social History Main Topics  . Smoking status: Former Games developer  . Smokeless tobacco: Never Used  . Alcohol use Yes  . Drug use: Unknown  . Sexual activity: Not Asked   Other Topics Concern  . None   Social History Narrative  . None   Hospital Course:  This is  the second admission assessment in this [facility] hospital  for Scott Chandler, a 40 year old Caucasian male with hx of chronic alcoholism. He was discharged from this hospital after alcohol detoxification treatments in July 29th of this year (2017).  [name] is being re-admitted to the [facility] adult unit from the [facility] with complaints of suicidal ideations with plans to hang himself. His BAL upon admission to the [facility] ED was 147. During this admission assessment, He reports, "My wife dropped me off at the ED last night. I have been drinking heavily for several days, up to a litre of liquor (Vodka) daily. I'm an alcoholic. I have been drinking heavily since 2009. There are a lot of reasons why I drink so much. My daughters are both in a group home suffering from bipolar disorders. They both got issues of their own. I have not been able to work since August 7th of this year. I resigned my job thinking that I was going to get a job in [place] that fell thru. So, I'm battling financial troubles as well. I have been depressed for many years. Was on Zoloft, gave me sexual problems. I have tried Cymbalta too. This time around, I would like to go to a long term substance treatment center after detox. I don not use any kind of drugs".  Scott Chandler was re-admitted to the Hospital with a blood alcohol level of 147. He did admit on admission to having been  drinking heavily. He considered himself an alcoholic. Scott Chandler cited familial stressors as the reason for his drinking too much. He was in need of alcohol detoxification as well as mood stabilization treatments.   After admission assessment/evaluation, Scott Chandler was started on Librium detox protocols for alcohol detox. He was medicated & discharged on; Duloxetine 40 mg for depression, gabapentin 300 mg for agitation/substance withdrawal symptoms, Hydroxyzine 25 mg prn for anxiety & Naltrxone 25 mg for alcoholism.[name]. He was enrolled & participated in the group counseling sessions being  offered & held on this unit. He learned coping skills.  Scott Chandler has completed detox treatments without complications. His mood is stable. His anxiety symptoms stabilized. There are no evidence of substance withdrawal symptoms. He appears to be medically & mentally stable to be discharged to [facility] treatment at the [facility] [place], [place]. Upon discharge, he adamantly denies any SIHI, AVH, delusional thoughts, paranoia or substance withdrawal symptoms. [facility] with all personal belongings in no apparent distress. Transportation per his arrangement.  Physical Findings: AIMS: Facial and Oral Movements Muscles of Facial Expression: None, normal Lips and Perioral Area: None, normal Jaw: None, normal Tongue: None, normal,Extremity Movements Upper (arms, wrists, hands, fingers): None, normal Lower (legs, knees, ankles, toes): None, normal, Trunk Movements Neck, shoulders, hips: None, normal, Overall Severity Severity of abnormal movements (highest score from questions above): None, normal Incapacitation due to abnormal movements: None, normal Patient's awareness of abnormal movements (rate only patient's report): No Awareness, Dental Status Current problems with teeth and/or dentures?: No Does patient usually wear dentures?: No  CIWA:  CIWA-Ar Total: 2 COWS:     Musculoskeletal: Strength & Muscle Tone: within normal limits Gait & Station: normal Patient leans: N/A  Psychiatric Specialty Exam: Physical Exam  Constitutional: He appears well-developed.  HENT:  Head: Normocephalic.  Eyes: Pupils are equal, round, and reactive to light.  Neck: Normal range of motion.  Cardiovascular: Normal rate.   Respiratory: Effort normal.  GI: Soft.  Genitourinary:  Genitourinary Comments: Denies any issues in this area  Musculoskeletal: Normal range of motion.  Neurological: He is alert.  Skin: Skin is warm.    Review of Systems  Constitutional: Negative.   HENT: Negative.    Eyes: Negative.   Respiratory: Negative.   Cardiovascular: Negative.   Gastrointestinal: Negative.   Genitourinary: Negative.   Musculoskeletal: Negative.   Skin: Negative.   Neurological: Negative.   Endo/Heme/Allergies: Negative.   Psychiatric/Behavioral: Positive for depression (Stable) and substance abuse (Alcoholism, chronic). Negative for hallucinations, memory loss and suicidal ideas. The patient has insomnia (Stable). The patient is not nervous/anxious.     Blood pressure 126/85, pulse 83, temperature 98.1 F (36.7 C), temperature source Oral, resp. rate 16, height 5\' 10"  (1.778 m), weight 108.9 kg (240 lb), SpO2 98 %.Body mass index is 34.44 kg/m.  See Md's SRA   Have you used any form of tobacco in the last 30 days? (Cigarettes, Smokeless Tobacco, Cigars, and/or Pipes): No  Has this patient used any form of tobacco in the last 30 days? (Cigarettes, Smokeless Tobacco, Cigars, and/or Pipes): No  Blood Alcohol level:  Lab Results  Component Value Date   ETH 147 (H) 12/26/2015   ETH 21 (H) 11/14/2015   Metabolic Disorder Labs:  No results found for: HGBA1C, MPG No results found for: PROLACTIN No results found for: CHOL, TRIG, HDL, CHOLHDL, VLDL, LDLCALC  See Psychiatric Specialty Exam and Suicide Risk Assessment completed by Attending Physician prior to discharge.  Discharge destination:  Home  Is patient on multiple antipsychotic therapies at discharge:  No   Has Patient had three or more failed trials of antipsychotic monotherapy by history:  No  Recommended Plan for Multiple Antipsychotic Therapies: NA    Medication List    STOP taking these medications   omeprazole 20 MG tablet Commonly known as:  PRILOSEC OTC   propranolol 20 MG tablet Commonly known as:  INDERAL   sertraline 100 MG tablet Commonly known as:  ZOLOFT   traZODone 50 MG tablet Commonly known as:  DESYREL     TAKE these medications     Indication  DULoxetine HCl 40 MG Cpep Take 40  mg by mouth daily. For depression  Indication:  Major Depressive Disorder   gabapentin 300 MG capsule Commonly known as:  NEURONTIN Take 1 capsule (300 mg total) by mouth at bedtime. For agitation  Indication:  Agitation   hydrOXYzine 25 MG tablet Commonly known as:  ATARAX/VISTARIL Take 1 tablet (25 mg total) by mouth every 6 (six) hours as needed for anxiety.  Indication:  Anxiety Neurosis   naltrexone 50 MG tablet Commonly known as:  DEPADE Take 0.5 tablets (25 mg total) by mouth daily. For alcohol cravings  Indication:  Excessive Use of Alcohol      Follow-up Information    [facility] .   Why:  Please call Shayla today to complete phone interview for possible admission for tomorrow 12/31/15. Referral made on 12/27/15. Contact information: [address] [place]  [phone]         Follow-up recommendations: Activity:  As tolerated Diet: As recommended by your primary care doctor. Keep all scheduled follow-up appointments as recommended.   Comments: Patient is instructed prior to discharge to: Take all medications as prescribed by his/her mental healthcare provider. Report any adverse effects and or reactions from the medicines to his/her outpatient provider promptly. Patient has been instructed & cautioned: To not engage in alcohol and or illegal drug use while on prescription medicines. In the event of worsening symptoms, patient is instructed to call the crisis hotline, 911 and or go to the nearest ED for appropriate evaluation and treatment of symptoms. To follow-up with his/her primary care provider for your other medical issues, concerns and or health care needs.   Signed: Sanjuana Kava, NP, PMHNP, FNP-BC 12/30/2015, 11:02 AM

## 2015-12-30 NOTE — [facility] Suicide Risk Assessment (Signed)
[facility]   Principal Problem: Alcohol use disorder, severe, dependence (HCC) Discharge Diagnoses:  Patient Active Problem List   Diagnosis Date Noted  . Alcohol use disorder, severe, dependence (HCC) [F10.20] 11/18/2015  . Alcohol abuse with alcohol-induced mood disorder (HCC) [F10.14] 11/14/2015  . EPIGASTRIC PAIN [R10.13] 10/22/2009  . FECAL OCCULT BLOOD [R19.5] 10/22/2009  . HYPERLIPIDEMIA [E78.5] 10/21/2009  . ESOPHAGITIS, HX OF [Z87.19] 10/21/2009  . GERD [K21.9] 05/31/2008  . CHEST PAIN [R07.9] 05/31/2008  . DIARRHEA [R19.7] 05/31/2008    Total Time spent with patient: 30 minutes  Musculoskeletal: Strength & Muscle Tone: within normal limits Gait & Station: normal Patient leans: N/A  Psychiatric Specialty Exam: Review of Systems  Psychiatric/Behavioral: Positive for substance abuse.  All other systems reviewed and are negative.   Blood pressure 126/85, pulse 83, temperature 98.1 F (36.7 C), temperature source Oral, resp. rate 16, height 5\' 10"  (1.778 m), weight 108.9 kg (240 lb), SpO2 98 %.Body mass index is 34.44 kg/m.  General Appearance: Casual  Eye Contact::  Fair  Speech:  Clear and Coherent409  Volume:  Normal  Mood:  Euthymic  Affect:  Appropriate  Thought Process:  Goal Directed  Orientation:  Full (Time, Place, and Person)  Thought Content:  Logical  Suicidal Thoughts:  No  Homicidal Thoughts:  No  Memory:  Immediate;   Fair Recent;   Fair Remote;   Fair  Judgement:  Fair  Insight:  Shallow  Psychomotor Activity:  Normal  Concentration:  Fair  Recall:  FiservFair  Fund of Knowledge:Fair  Language: Fair  Akathisia:  No  Handed:  Right  AIMS (if indicated):     Assets:  Desire for Improvement  Sleep:  Number of Hours: 4  Cognition: WNL  ADL's:  Intact   Mental Status Per Nursing Assessment::   On Admission:  Suicidal ideation indicated by patient, Suicide plan, Plan includes specific time, place, or method, Self-harm  thoughts, Self-harm behaviors, Intention to act on suicide plan, Belief that plan would result in death  Demographic Factors:  Male  Loss Factors: NA  Historical Factors: Impulsivity  Risk Reduction Factors:   Positive social support  Continued Clinical Symptoms:  Alcohol/Substance Abuse/Dependencies  Cognitive Features That Contribute To Risk:  None    Suicide Risk:  Minimal: No identifiable suicidal ideation.  Patients presenting with no risk factors but with morbid ruminations; may be classified as minimal risk based on the severity of the depressive symptoms  Follow-up Information    [facility] Follow up on 01/02/2016.   Why:  Assessment for possible admission on Thursday Sept. 7th at 11:45am. Please bring ID letter, social security card or birth certificate, and 30 days worth of medications. Call office if you need to reschedule.  Contact information: [place] [place] [address] [phone]           Plan Of Care/Follow-up recommendations:  Activity:  NO RESTRICTIONS Diet:  REGULAR  Oneil Behney, MD 12/30/2015, 10:01 AM

## 2015-12-30 NOTE — [facility] Suicide Risk Assessment (Signed)
[facility] INPATIENT:  Family/Significant Other Suicide Prevention Education  Suicide Prevention Education:  Education Completed; wife Scott Chandler [phone],  (name of family member/significant other) has been identified by the patient as the family member/significant other with whom the patient will be residing, and identified as the person(s) who will aid the patient in the event of a mental health crisis (suicidal ideations/suicide attempt).  With written consent from the patient, the family member/significant other has been provided the following suicide prevention education, prior to the and/or following the discharge of the patient.  The suicide prevention education provided includes the following:  Suicide risk factors  Suicide prevention and interventions  National Suicide Hotline telephone number  [facility] assessment telephone number  [facility] Emergency Assistance 911  [facility] and/or Residential Mobile Crisis Unit telephone number  Request made of family/significant other to:  Remove weapons (e.g., guns, rifles, knives), all items previously/currently identified as safety concern.    Remove drugs/medications (over-the-counter, prescriptions, illicit drugs), all items previously/currently identified as a safety concern.  The family member/significant other verbalizes understanding of the suicide prevention education information provided.  The family member/significant other agrees to remove the items of safety concern listed above.  Kiyani Jernigan L Alynna Hargrove 12/30/2015, 12:36 PM

## 2016-06-10 ENCOUNTER — Ambulatory Visit (HOSPITAL_COMMUNITY): Admission: RE | Admit: 2016-06-10 | Payer: BLUE CROSS/BLUE SHIELD | Source: Home / Self Care | Admitting: Psychiatry

## 2016-06-10 ENCOUNTER — Ambulatory Visit (HOSPITAL_COMMUNITY)
Admission: RE | Admit: 2016-06-10 | Discharge: 2016-06-10 | Disposition: A | Discharge status: Home / Self Care | Payer: BLUE CROSS/BLUE SHIELD | Attending: Psychiatry | Admitting: Psychiatry

## 2016-06-10 ENCOUNTER — Emergency Department (HOSPITAL_COMMUNITY)
Admission: EM | Admit: 2016-06-10 | Discharge: 2016-06-11 | Disposition: A | Discharge status: Other Acute Inpatient Hospital | Payer: No Typology Code available for payment source | Attending: Emergency Medicine | Admitting: Emergency Medicine

## 2016-06-10 ENCOUNTER — Encounter (HOSPITAL_COMMUNITY): Payer: Self-pay | Admitting: *Deleted

## 2016-06-10 DIAGNOSIS — Z87891 Personal history of nicotine dependence: Secondary | ICD-10-CM | POA: Insufficient documentation

## 2016-06-10 DIAGNOSIS — F1010 Alcohol abuse, uncomplicated: Secondary | ICD-10-CM

## 2016-06-10 DIAGNOSIS — R45851 Suicidal ideations: Secondary | ICD-10-CM

## 2016-06-10 DIAGNOSIS — F1014 Alcohol abuse with alcohol-induced mood disorder: Secondary | ICD-10-CM | POA: Diagnosis present

## 2016-06-10 DIAGNOSIS — Z79899 Other long term (current) drug therapy: Secondary | ICD-10-CM | POA: Insufficient documentation

## 2016-06-10 LAB — RAPID URINE DRUG SCREEN, HOSP PERFORMED
AMPHETAMINES: NOT DETECTED
BENZODIAZEPINES: NOT DETECTED
Barbiturates: NOT DETECTED
Cocaine: NOT DETECTED
OPIATES: NOT DETECTED
TETRAHYDROCANNABINOL: NOT DETECTED

## 2016-06-10 LAB — COMPREHENSIVE METABOLIC PANEL WITH GFR
ALT: 34 U/L (ref 17–63)
AST: 32 U/L (ref 15–41)
Albumin: 4.7 g/dL (ref 3.5–5.0)
Alkaline Phosphatase: 67 U/L (ref 38–126)
Anion gap: 15 (ref 5–15)
BUN: 16 mg/dL (ref 6–20)
CO2: 25 mmol/L (ref 22–32)
Calcium: 9.2 mg/dL (ref 8.9–10.3)
Chloride: 101 mmol/L (ref 101–111)
Creatinine, Ser: 0.98 mg/dL (ref 0.61–1.24)
GFR calc Af Amer: >60 mL/min
GFR calc non Af Amer: >60 mL/min
Glucose, Bld: 92 mg/dL (ref 65–99)
Potassium: 3.3 mmol/L — ABNORMAL LOW (ref 3.5–5.1)
Sodium: 141 mmol/L (ref 135–145)
Total Bilirubin: 0.9 mg/dL (ref 0.3–1.2)
Total Protein: 7.6 g/dL (ref 6.5–8.1)

## 2016-06-10 LAB — CBC
HCT: 46.3 % (ref 39.0–52.0)
HEMOGLOBIN: 16.5 g/dL (ref 13.0–17.0)
MCH: 31.2 pg (ref 26.0–34.0)
MCHC: 35.6 g/dL (ref 30.0–36.0)
MCV: 87.5 fL (ref 78.0–100.0)
PLATELETS: 260 10*3/uL (ref 150–400)
RBC: 5.29 MIL/uL (ref 4.22–5.81)
RDW: 13.3 % (ref 11.5–15.5)
WBC: 14 10*3/uL — ABNORMAL HIGH (ref 4.0–10.5)

## 2016-06-10 LAB — ETHANOL: Alcohol, Ethyl (B): 171 mg/dL — ABNORMAL HIGH (ref <5)

## 2016-06-10 MED ORDER — LORAZEPAM 1 MG PO TABS: 0.0000 mg | ORAL_TABLET | Freq: Two times a day (BID) | ORAL | Status: DC

## 2016-06-10 MED ORDER — GABAPENTIN 300 MG PO CAPS
300.0000 mg | ORAL_CAPSULE | Freq: Every day | ORAL | Status: DC
  Administered 2016-06-10: 300 mg via ORAL
  Filled 2016-06-10: qty 1

## 2016-06-10 MED ORDER — LORAZEPAM 1 MG PO TABS
0.0000 mg | ORAL_TABLET | Freq: Four times a day (QID) | ORAL | Status: DC
  Administered 2016-06-10 – 2016-06-11 (×2): 1 mg via ORAL
  Administered 2016-06-11: 2 mg via ORAL
  Filled 2016-06-10: qty 2
  Filled 2016-06-10 (×2): qty 1

## 2016-06-10 MED ORDER — ACETAMINOPHEN 325 MG PO TABS: 650.0000 mg | ORAL_TABLET | ORAL | Status: DC | PRN

## 2016-06-10 MED ORDER — THIAMINE HCL 100 MG/ML IJ SOLN: 100.0000 mg | Freq: Every day | INTRAMUSCULAR | Status: DC

## 2016-06-10 MED ORDER — LORAZEPAM 1 MG PO TABS: 1.0000 mg | ORAL_TABLET | Freq: Three times a day (TID) | ORAL | Status: DC | PRN

## 2016-06-10 MED ORDER — ONDANSETRON HCL 4 MG PO TABS
4.0000 mg | ORAL_TABLET | Freq: Three times a day (TID) | ORAL | Status: DC | PRN
  Administered 2016-06-10: 4 mg via ORAL
  Filled 2016-06-10: qty 1

## 2016-06-10 MED ORDER — DULOXETINE HCL 20 MG PO CPEP
40.0000 mg | ORAL_CAPSULE | Freq: Every day | ORAL | Status: DC
  Administered 2016-06-10 – 2016-06-11 (×2): 40 mg via ORAL
  Filled 2016-06-10 (×2): qty 2

## 2016-06-10 MED ORDER — VITAMIN B-1 100 MG PO TABS
100.0000 mg | ORAL_TABLET | Freq: Every day | ORAL | Status: DC
  Administered 2016-06-10 – 2016-06-11 (×2): 100 mg via ORAL
  Filled 2016-06-10 (×2): qty 1

## 2016-06-10 NOTE — ED Triage Notes (Signed)
Pt stated "[facility] and got tired of waiting so I escaped and was sitting in the woods for hours.  That's where all the scratches came from.  I drink at least a 5th a day of Vodka.  I'm married but currently living with my girlfriend, not my wife.  I did work until last week.  I resigned.  I'm an Publishing rights managerUrban Planner.  I resigned because I have problems with authority."
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
## 2016-06-10 NOTE — BHH Counselor (Signed)
Clinician contacted Vivia Ewingerry, Charge Nurse; expressed [facility] and [facility].  Gwinda Passereylese D Bennett, MS, [facility], [facility] Triage Specialist [phone]

## 2016-06-10 NOTE — H&P (Signed)
Behavioral Health Medical Screening Exam  Scott Chandler is an 41 y.o. male.  Total Time spent with patient: 15 minutes  Psychiatric Specialty Exam: Physical Exam  Constitutional: He is oriented to person, place, and time. He appears well-developed and well-nourished.  Neck: Normal range of motion.  Cardiovascular: Normal rate and normal heart sounds.   Respiratory: Effort normal and breath sounds normal.  GI: Soft.  Musculoskeletal: Normal range of motion.  Neurological: He is alert and oriented to person, place, and time.  Skin: Skin is warm and dry.    Review of Systems  Psychiatric/Behavioral: Positive for depression, hallucinations (visual when detoxing), substance abuse and suicidal ideas.    Blood pressure 121/89, pulse (!) 114, resp. rate 18, SpO2 98 %.There is no height or weight on file to calculate BMI.  General Appearance: Casual and Fairly Groomed  Eye Contact:  Good  Speech:  Clear and Coherent and Normal Rate  Volume:  Normal  Mood:  Depressed and Irritable  Affect:  Appropriate, Congruent and Depressed  Thought Process:  Coherent, Goal Directed and Linear  Orientation:  Full (Time, Place, and Person)  Thought Content:  Logical  Suicidal Thoughts:  Yes.  without intent/plan  Homicidal Thoughts:  No  Memory:  Immediate;   Good Recent;   Fair Remote;   Fair  Judgement:  Fair  Insight:  Fair  Psychomotor Activity:  Normal  Concentration: Concentration: Good and Attention Span: Good  Recall:  Good  Fund of Knowledge:Good  Language: Good  Akathisia:  No  Handed:  Right  AIMS (if indicated):     Assets:  Communication Skills Desire for Improvement Financial Resources/Insurance Housing Physical Health Resilience Social Support Transportation Vocational/Educational  Sleep:       Musculoskeletal: Strength & Muscle Tone: within normal limits Gait & Station: normal Patient leans: N/A  Blood pressure 121/89, pulse (!) 114, resp. rate 18, SpO2 98  %.  Recommendations:  Based on my evaluation the patient does not appear to have an emergency medical condition.  Pt meets criteria for inpatient psychiatric admission when medically cleared.   Laveda AbbeLaurie Britton Parks, NP 06/10/2016, 7:40 PM

## 2016-06-10 NOTE — ED Notes (Addendum)
Pt stated "[place] have been staying in a motel, my wife came & picked me up and brought me down here."   Pt's last drink was this morning but time unknown.  Pt now vomiting.

## 2016-06-10 NOTE — BH Assessment (Signed)
[facility] but left without being seen after filling out walk in paper work.

## 2016-06-10 NOTE — ED Notes (Signed)
Pt presents with alcohol abuse and depression, quit job last week.  [facility] also [facility], and sat in woods for awhile.  Minor scratches noted to arms.  Pt awake, alert & responsive, mild nausea noted, calm & cooperative.  Denies SI, complaint of depression.  Denies HI or AVH.  Monitoring for safety, Q 15 min checks in effect.  Pt states he drinks as much alcohol as he can get his hands on, Binge drinks also.  Safety check for contraband completed, no items found.

## 2016-06-10 NOTE — ED Notes (Signed)
Pt now vomiting

## 2016-06-11 ENCOUNTER — Encounter (HOSPITAL_COMMUNITY): Payer: Self-pay

## 2016-06-11 ENCOUNTER — Observation Stay (HOSPITAL_COMMUNITY)
Admission: AD | Admit: 2016-06-11 | Discharge: 2016-06-12 | Disposition: A | Discharge status: Home / Self Care | Payer: BLUE CROSS/BLUE SHIELD | Source: Intra-hospital | Attending: Psychiatry | Admitting: Psychiatry

## 2016-06-11 DIAGNOSIS — Z8249 Family history of ischemic heart disease and other diseases of the circulatory system: Secondary | ICD-10-CM | POA: Insufficient documentation

## 2016-06-11 DIAGNOSIS — R1013 Epigastric pain: Secondary | ICD-10-CM | POA: Insufficient documentation

## 2016-06-11 DIAGNOSIS — Z809 Family history of malignant neoplasm, unspecified: Secondary | ICD-10-CM | POA: Insufficient documentation

## 2016-06-11 DIAGNOSIS — F1014 Alcohol abuse with alcohol-induced mood disorder: Secondary | ICD-10-CM

## 2016-06-11 DIAGNOSIS — R45851 Suicidal ideations: Secondary | ICD-10-CM

## 2016-06-11 DIAGNOSIS — Z87891 Personal history of nicotine dependence: Secondary | ICD-10-CM | POA: Insufficient documentation

## 2016-06-11 DIAGNOSIS — F329 Major depressive disorder, single episode, unspecified: Secondary | ICD-10-CM | POA: Insufficient documentation

## 2016-06-11 DIAGNOSIS — Z79899 Other long term (current) drug therapy: Secondary | ICD-10-CM

## 2016-06-11 DIAGNOSIS — E785 Hyperlipidemia, unspecified: Secondary | ICD-10-CM | POA: Insufficient documentation

## 2016-06-11 DIAGNOSIS — K210 Gastro-esophageal reflux disease with esophagitis: Secondary | ICD-10-CM | POA: Insufficient documentation

## 2016-06-11 DIAGNOSIS — R079 Chest pain, unspecified: Secondary | ICD-10-CM | POA: Insufficient documentation

## 2016-06-11 DIAGNOSIS — F411 Generalized anxiety disorder: Secondary | ICD-10-CM | POA: Insufficient documentation

## 2016-06-11 DIAGNOSIS — R197 Diarrhea, unspecified: Secondary | ICD-10-CM | POA: Insufficient documentation

## 2016-06-11 MED ORDER — LORAZEPAM 1 MG PO TABS
1.0000 mg | ORAL_TABLET | Freq: Four times a day (QID) | ORAL | Status: DC
  Administered 2016-06-11 – 2016-06-12 (×3): 1 mg via ORAL
  Filled 2016-06-11 (×3): qty 1

## 2016-06-11 MED ORDER — LORAZEPAM 1 MG PO TABS: 1.0000 mg | ORAL_TABLET | Freq: Two times a day (BID) | ORAL | Status: DC

## 2016-06-11 MED ORDER — THIAMINE HCL 100 MG/ML IJ SOLN: 100.0000 mg | Freq: Once | INTRAMUSCULAR | Status: DC

## 2016-06-11 MED ORDER — MAGNESIUM HYDROXIDE 400 MG/5ML PO SUSP: 30.0000 mL | Freq: Every day | ORAL | Status: DC | PRN

## 2016-06-11 MED ORDER — ONDANSETRON 4 MG PO TBDP: 4.0000 mg | ORAL_TABLET | Freq: Four times a day (QID) | ORAL | Status: DC | PRN

## 2016-06-11 MED ORDER — LOPERAMIDE HCL 2 MG PO CAPS: 2.0000 mg | ORAL_CAPSULE | ORAL | Status: DC | PRN

## 2016-06-11 MED ORDER — DULOXETINE HCL 20 MG PO CPEP
40.0000 mg | ORAL_CAPSULE | Freq: Every day | ORAL | Status: DC
Start: 2016-06-12 — End: 2016-06-12
  Administered 2016-06-12: 40 mg via ORAL
  Filled 2016-06-11: qty 2

## 2016-06-11 MED ORDER — THIAMINE HCL 100 MG/ML IJ SOLN: 100.0000 mg | Freq: Every day | INTRAMUSCULAR | Status: DC

## 2016-06-11 MED ORDER — LORAZEPAM 1 MG PO TABS: 1.0000 mg | ORAL_TABLET | Freq: Four times a day (QID) | ORAL | Status: DC | PRN

## 2016-06-11 MED ORDER — GABAPENTIN 300 MG PO CAPS
300.0000 mg | ORAL_CAPSULE | Freq: Every day | ORAL | Status: DC
  Administered 2016-06-11: 300 mg via ORAL

## 2016-06-11 MED ORDER — ADULT MULTIVITAMIN W/MINERALS CH
1.0000 | ORAL_TABLET | Freq: Every day | ORAL | Status: DC
  Administered 2016-06-11 – 2016-06-12 (×2): 1 via ORAL
  Filled 2016-06-11 (×2): qty 1

## 2016-06-11 MED ORDER — GABAPENTIN 300 MG PO CAPS
ORAL_CAPSULE | ORAL | Status: AC
  Filled 2016-06-11: qty 1

## 2016-06-11 MED ORDER — VITAMIN B-1 100 MG PO TABS
100.0000 mg | ORAL_TABLET | Freq: Every day | ORAL | Status: DC
  Filled 2016-06-11: qty 1

## 2016-06-11 MED ORDER — LORAZEPAM 1 MG PO TABS
0.0000 mg | ORAL_TABLET | Freq: Four times a day (QID) | ORAL | Status: DC
  Administered 2016-06-11 – 2016-06-12 (×3): 1 mg via ORAL
  Filled 2016-06-11 (×2): qty 1

## 2016-06-11 MED ORDER — LORAZEPAM 1 MG PO TABS: 1.0000 mg | ORAL_TABLET | Freq: Three times a day (TID) | ORAL | Status: DC

## 2016-06-11 MED ORDER — DULOXETINE HCL 20 MG PO CPEP: 40.0000 mg | ORAL_CAPSULE | Freq: Every day | ORAL | Status: DC

## 2016-06-11 MED ORDER — IBUPROFEN 800 MG PO TABS
800.0000 mg | ORAL_TABLET | Freq: Four times a day (QID) | ORAL | Status: DC | PRN
Start: 2016-06-11 — End: 2016-06-12
  Administered 2016-06-11: 800 mg via ORAL
  Filled 2016-06-11: qty 1

## 2016-06-11 MED ORDER — VITAMIN B-1 100 MG PO TABS
100.0000 mg | ORAL_TABLET | Freq: Every day | ORAL | Status: DC
  Administered 2016-06-12: 100 mg via ORAL

## 2016-06-11 MED ORDER — ALUM & MAG HYDROXIDE-SIMETH 200-200-20 MG/5ML PO SUSP: 30.0000 mL | ORAL | Status: DC | PRN

## 2016-06-11 MED ORDER — LORAZEPAM 1 MG PO TABS: 0.0000 mg | ORAL_TABLET | Freq: Two times a day (BID) | ORAL | Status: DC

## 2016-06-11 MED ORDER — HYDROXYZINE HCL 25 MG PO TABS: 25.0000 mg | ORAL_TABLET | Freq: Four times a day (QID) | ORAL | Status: DC | PRN

## 2016-06-11 MED ORDER — ACETAMINOPHEN 325 MG PO TABS: 650.0000 mg | ORAL_TABLET | ORAL | Status: DC | PRN

## 2016-06-11 MED ORDER — LORAZEPAM 1 MG PO TABS: 1.0000 mg | ORAL_TABLET | Freq: Three times a day (TID) | ORAL | Status: DC | PRN

## 2016-06-11 MED ORDER — ONDANSETRON HCL 4 MG PO TABS: 4.0000 mg | ORAL_TABLET | Freq: Three times a day (TID) | ORAL | Status: DC | PRN

## 2016-06-11 MED ORDER — LORAZEPAM 1 MG PO TABS: 1.0000 mg | ORAL_TABLET | Freq: Every day | ORAL | Status: DC

## 2016-06-11 NOTE — BH Assessment (Addendum)
Patient referred to the following facilities:  [facility] - [phone] [facility] - [phone] [facility] - [phone] [facility] - [phone]  Davina PokeJoVea Jacere Pangborn, LCSW Therapeutic Triage Specialist [facility] 06/11/2016 4:55 AM

## 2016-06-11 NOTE — BH Assessment (Signed)
BHH Assessment Progress Note  Per Thedore MinsMojeed Akintayo, MD, this pt would benefit [facility] at [facility].  Malva LimesLinsey Strader, RN, [facility] has [facility] pt to Obs 5.  Pt has signed Voluntary Admission and Consent for Treatment, as well as Consent to Release Information to his PCP, and signed [facility].  Pt's nurse, Rudean HittDawnaly, has been notified, and agrees to send original paperwork along with pt via [facility] [place], and to call report to [phone] or [phone].  Doylene Canninghomas Kristain Hu, MA Triage Specialist [phone]

## 2016-06-11 NOTE — H&P (Signed)
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 Alcohol abuse with alcohol-induced mood disorder ([place]) [F10.14] 11/14/2015    Priority: High  . EPIGASTRIC PAIN [R10.13] 10/22/2009  . FECAL OCCULT BLOOD [R19.5] 10/22/2009  . HYPERLIPIDEMIA [E78.5] 10/21/2009  . ESOPHAGITIS, HX OF [Z87.19] 10/21/2009  . GERD [K21.9] 05/31/2008  . CHEST PAIN [R07.9] 05/31/2008  . DIARRHEA [R19.7] 05/31/2008   History of Present Illness: Scott Chandler is a 41 year old male who presents to the [facility] OBS unit from [facility] after coming to [facility] on 06-10-16 as a walk-in for alcohol abuse. Pt stated he has been drinking heavily for a long time and wants to go to long term rehab for alcohol treatment. Pt stated he drinks as much as he can get his hands on, whenever he can get it. Pt stated he had been living with a girlfriend who kicked him out and he has lived in a motel for the past week in [place]. Pt stated he recently quit his job so he is currently unemployed. Pt stated his wife, who lives in [place], came to [place] to get him after her text. Pt's wife brought him to [facility] so he can get help for his alcohol abuse. Pt has been inpatient at [facility] twice in 2017; July & August for alcohol abuse.   Associated Signs/Symptoms: Depression Symptoms:  depressed mood, fatigue, hopelessness, suicidal thoughts without plan, disturbed sleep, decreased appetite, (Hypo) Manic Symptoms:  None present Anxiety Symptoms:  None present Psychotic Symptoms:  None present PTSD Symptoms: NA Total Time spent with patient: 20 minutes  Past Psychiatric History: Alcohol induced mood disorder.  Is the patient at risk to self? Yes.     Has the patient been a risk to self in the past 6 months? Yes.    Has the patient been a risk to self within the distant past? No.  Is the patient a risk to others? No.  Has the patient been a risk to others in the past 6 months? No.  Has the patient been a risk to others within the distant past? No.   Prior Inpatient Therapy:  Yes, 2x at [facility] in 2017 Prior Outpatient Therapy:  No  Alcohol Screening:   Substance Abuse History in the last 12 months:  Yes.   Consequences of Substance Abuse: Medical Consequences:  health decline Family Consequences:  Loss of marriage, loss of girlfriend, loss of job Blackouts:  Pt reports periods of time missing DT's: Pt reported having DT's during withdrawl before Previous Psychotropic Medications: No Psychological Evaluations: Yes Past Medical History:  Past Medical History:  Diagnosis Date  . Chest pain   . Depression   . GERD (gastroesophageal reflux disease)     Past Surgical History:  Procedure Laterality Date  . TRANSTHORACIC ECHOCARDIOGRAM  05/18/2008   normal LVF/ mild LVH/trace mitral and tricuspid regurgitation   Family History:  Family History  Problem Relation Age of Onset  . Hypertension Father   . Cancer Mother    Family Psychiatric History: Alcoholism Tobacco Screening:   Social History:  History  Alcohol Use  . Yes    Comment: 5th a day     History  Drug Use No    Additional Social History:  Allergies:  No Known Allergies Lab Results:  Results for orders placed or performed during the hospital encounter of 06/10/16 (from the past 48 hour(s))  Comprehensive metabolic panel     Status: Abnormal   Collection Time: 06/10/16  9:05 PM  Result Value Ref Range   Sodium 141 135 - 145 mmol/L   Potassium 3.3 (L) 3.5 - 5.1 mmol/L   Chloride 101 101 - 111 mmol/L   CO2 25 22 - 32 mmol/L   Glucose, Bld 92 65 - 99 mg/dL   BUN 16 6 - 20 mg/dL   Creatinine, Ser 0.98 0.61 - 1.24 mg/dL   Calcium 9.2 8.9 - 10.3 mg/dL    Total Protein 7.6 6.5 - 8.1 g/dL   Albumin 4.7 3.5 - 5.0 g/dL   AST 32 15 - 41 U/L   ALT 34 17 - 63 U/L   Alkaline Phosphatase 67 38 - 126 U/L   Total Bilirubin 0.9 0.3 - 1.2 mg/dL   GFR calc non Af Amer >60 >60 mL/min   GFR calc Af Amer >60 >60 mL/min    Comment: (NOTE) The eGFR has been calculated using the CKD EPI equation. This calculation has not been validated in all clinical situations. eGFR's persistently <60 mL/min signify possible Chronic Kidney Disease.    Anion gap 15 5 - 15  Ethanol     Status: Abnormal   Collection Time: 06/10/16  9:05 PM  Result Value Ref Range   Alcohol, Ethyl (B) 171 (H) <5 mg/dL    Comment:        LOWEST DETECTABLE LIMIT FOR SERUM ALCOHOL IS 5 mg/dL FOR MEDICAL PURPOSES ONLY   cbc     Status: Abnormal   Collection Time: 06/10/16  9:05 PM  Result Value Ref Range   WBC 14.0 (H) 4.0 - 10.5 K/uL   RBC 5.29 4.22 - 5.81 MIL/uL   Hemoglobin 16.5 13.0 - 17.0 g/dL   HCT 46.3 39.0 - 52.0 %   MCV 87.5 78.0 - 100.0 fL   MCH 31.2 26.0 - 34.0 pg   MCHC 35.6 30.0 - 36.0 g/dL   RDW 13.3 11.5 - 15.5 %   Platelets 260 150 - 400 K/uL  Rapid urine drug screen (hospital performed)     Status: None   Collection Time: 06/10/16  9:05 PM  Result Value Ref Range   Opiates NONE DETECTED NONE DETECTED   Cocaine NONE DETECTED NONE DETECTED   Benzodiazepines NONE DETECTED NONE DETECTED   Amphetamines NONE DETECTED NONE DETECTED   Tetrahydrocannabinol NONE DETECTED NONE DETECTED   Barbiturates NONE DETECTED NONE DETECTED    Comment:        DRUG SCREEN FOR MEDICAL PURPOSES ONLY.  IF CONFIRMATION IS NEEDED FOR ANY PURPOSE, NOTIFY LAB WITHIN 5 DAYS.        LOWEST DETECTABLE LIMITS FOR URINE DRUG SCREEN Drug Class       Cutoff (ng/mL) Amphetamine      1000 Barbiturate      200 Benzodiazepine   200 Tricyclics       300 Opiates          300 Cocaine          300 THC              50     Blood Alcohol level:  Lab Results  Component Value Date   ETH 171  (H) 06/10/2016   ETH 147 (H) 12/26/2015    Metabolic Disorder Labs:  No results found   for: HGBA1C, MPG No results found for: PROLACTIN No results found for: CHOL, TRIG, HDL, CHOLHDL, VLDL, LDLCALC  Current Medications: Current Facility-Administered Medications  Medication Dose Route Frequency Provider Last Rate Last Dose  . [START ON 06/12/2016] DULoxetine (CYMBALTA) DR capsule 40 mg  40 mg Oral Daily Ethelene Hal, NP      . gabapentin (NEURONTIN) capsule 300 mg  300 mg Oral QHS Ethelene Hal, NP      . hydrOXYzine (ATARAX/VISTARIL) tablet 25 mg  25 mg Oral Q6H PRN Ethelene Hal, NP      . loperamide (IMODIUM) capsule 2-4 mg  2-4 mg Oral PRN Ethelene Hal, NP      . LORazepam (ATIVAN) tablet 1 mg  1 mg Oral Q6H PRN Ethelene Hal, NP      . LORazepam (ATIVAN) tablet 1 mg  1 mg Oral QID Ethelene Hal, NP       Followed by  . [START ON 06/12/2016] LORazepam (ATIVAN) tablet 1 mg  1 mg Oral TID Ethelene Hal, NP       Followed by  . [START ON 06/13/2016] LORazepam (ATIVAN) tablet 1 mg  1 mg Oral BID Ethelene Hal, NP       Followed by  . [START ON 06/15/2016] LORazepam (ATIVAN) tablet 1 mg  1 mg Oral Daily Ethelene Hal, NP      . multivitamin with minerals tablet 1 tablet  1 tablet Oral Daily Ethelene Hal, NP      . ondansetron (ZOFRAN-ODT) disintegrating tablet 4 mg  4 mg Oral Q6H PRN Ethelene Hal, NP      . thiamine (B-1) injection 100 mg  100 mg Intramuscular Once Ethelene Hal, NP      . Derrill Memo ON 06/12/2016] thiamine (VITAMIN B-1) tablet 100 mg  100 mg Oral Daily Ethelene Hal, NP       PTA Medications: Prescriptions Prior to Admission  Medication Sig Dispense Refill Last Dose  . DULoxetine HCl 40 MG CPEP Take 40 mg by mouth daily. For depression 30 capsule 0 Past Week at Unknown time  . gabapentin (NEURONTIN) 300 MG capsule Take 1 capsule (300 mg total) by mouth at bedtime. For agitation 90  capsule 0 Past Month at Unknown time  . hydrOXYzine (ATARAX/VISTARIL) 25 MG tablet Take 1 tablet (25 mg total) by mouth every 6 (six) hours as needed for anxiety. (Patient not taking: Reported on 06/10/2016) 60 tablet 0 Not Taking at Unknown time  . naltrexone (DEPADE) 50 MG tablet Take 0.5 tablets (25 mg total) by mouth daily. For alcohol cravings (Patient not taking: Reported on 06/10/2016) 30 tablet 0 Not Taking at Unknown time    Musculoskeletal: Strength & Muscle Tone: within normal limits Gait & Station: normal Patient leans: N/A  Psychiatric Specialty Exam: Physical Exam  Constitutional: He appears well-developed and well-nourished.  HENT:  Head: Normocephalic.  Musculoskeletal: Normal range of motion.  Skin: Skin is warm and dry.    Review of Systems  Constitutional: Negative.   HENT: Negative.   Eyes: Negative.   Respiratory: Negative.   Cardiovascular: Negative.   Gastrointestinal: Negative.   Genitourinary: Negative.   Musculoskeletal: Positive for back pain.  Skin: Negative.   Psychiatric/Behavioral: Positive for depression, substance abuse and suicidal ideas. Negative for hallucinations and memory loss. The patient is not nervous/anxious and does not have insomnia.     There were no vitals taken for this visit.There is no height or weight  on file to calculate BMI.  General Appearance: Casual and Fairly Groomed  Eye Contact:  Good  Speech:  Clear and Coherent and Normal Rate  Volume:  Decreased  Mood:  Depressed and Hopeless  Affect:  Appropriate, Congruent, Depressed and Flat  Thought Process:  Coherent, Goal Directed and Linear  Orientation:  Full (Time, Place, and Person)  Thought Content:  WDL and Logical  Suicidal Thoughts:  Yes.  without intent/plan  Homicidal Thoughts:  No  Memory:  Immediate;   Good Recent;   Good Remote;   Fair  Judgement:  Fair  Insight:  Fair  Psychomotor Activity:  Normal  Concentration:  Concentration: Good and Attention Span:  Good  Recall:  Good  Fund of Knowledge:  Good  Language:  Good  Akathisia:  No  Handed:  Right  AIMS (if indicated):     Assets:  Communication Skills Desire for Improvement Financial Resources/Insurance Resilience Social Support Vocational/Educational  ADL's:  Intact  Cognition:  WNL  Sleep:   Fair      Treatment Plan Summary: Daily contact with patient to assess and evaluate symptoms and progress in treatment and Medication management  Will monitor for crisis stabilization and progression of treatment Will assist with finding outpatient treatment resources for alcohol treatment  Observation Level/Precautions:  Continuous Observation Laboratory:  CBC Chemistry Profile UDS   Medications:  CIWA Protocol Cymbalta 40 mg qd Gabapentin 359m QHS    Discharge Concerns:  Housing, employment, insurance, rehab for alcohol abuse Estimated LOS:24-48 hours       LEthelene Hal NP 2/15/20183:07 PM

## 2016-06-11 NOTE — Progress Notes (Signed)
DAR NOTE: Patient awake at the time shift change watching TV and interacting with staff. Cheerful and pleasant. Denies pain, SI/HI, AH/VH at this time. Endorses mild depression and reports insomnia but patient observed sleeping at the time this Clinical research associatewriter is writing this report. No distress and behavioral issues noted. Constant observation maintained for safety except when patient is in the bathroom. Patient remains safe at this time.

## 2016-06-11 NOTE — Progress Notes (Signed)
[facility] at 1515 ambulatory and alert. Pt's mood is appropriate to circumstances. Denies SI/HI but complains of depression and "seeing weird shit" but wouldn't elaborate. Pt also states that he hears voices sometimes such as someone saying, "I'm home". Pt states that he is homeless at this time and has lost his job. He admits to drinking a pint or more vodka everyday. He says his triggers are money but also states that he drinks when he's happy and he drinks when he's sad. He admits to repeated admissions to [facility] but ends up drinking again once he's discharged. His goals are to stop drinking and to repair his relationship with his wife. He complains of 5/5 back pain. He is noted to have approximately 12 1/2 cm scratches on his right arm and right upper back. The scratched areas are healed over with no drainage and no s/s infection. He states that he obtained the scratches while in the woods but again would not elaborate. The rest of his skin appears to be unremarkable. His vital signs are stable and his CIWA was a 4. At this time pt is watching TV in bed with no complaints.

## 2016-06-11 NOTE — ED Notes (Signed)
Patient transferred to [facility].  He has been pleasant and cooperative this morning.  He left the unit ambulatory.  All belongings given to the [place] driver.

## 2016-06-11 NOTE — Progress Notes (Signed)
06/11/2016-Patient spoke with NP regarding his disposition. Patient would like to seek assistance with his alcohol addiction from a recovery/treatment center. Carmell Austriaisha Reagan Behlke 06/11/2016

## 2016-06-11 NOTE — Consult Note (Signed)
[place] [facility] Consult   Reason for Consult:  Alcohol abuse with suicidal ideations Referring Physician:  EDP Patient Identification: Scott Chandler MRN:  [id number] Principal Diagnosis: Alcohol abuse with alcohol-induced mood disorder [facility]) Diagnosis:   Patient Active Problem List   Diagnosis Date Noted  . Alcohol abuse with alcohol-induced mood disorder ([place]) [F10.14] 11/14/2015    Priority: High  . EPIGASTRIC PAIN [R10.13] 10/22/2009  . FECAL OCCULT BLOOD [R19.5] 10/22/2009  . HYPERLIPIDEMIA [E78.5] 10/21/2009  . ESOPHAGITIS, HX OF [Z87.19] 10/21/2009  . GERD [K21.9] 05/31/2008  . CHEST PAIN [R07.9] 05/31/2008  . DIARRHEA [R19.7] 05/31/2008    Total Time spent with patient: 45 minutes  Subjective:   Scott Chandler is a 41 y.o. male patient admitted with alcohol dependence with depression.  HPI:  41 yo male who presented to the ED under the influence of alcohol with suicidal ideations.  Today, he denies suicidal ideations but agreeable to get some assistance with his alcohol abuse.  He drinks daily "as much as I can" and never having rehab.  [place] detox in September.  No homicidal ideations, hallucinations, or withdrawal symptoms.  Past Psychiatric History: alcohol abuse  Risk to Self: Is patient at risk for suicide?: No, but patient needs Medical Clearance Risk to Others:  No Prior Inpatient Therapy:  [facility] Prior Outpatient Therapy:  None  Past Medical History:  Past Medical History:  Diagnosis Date  . Chest pain   . Depression   . GERD (gastroesophageal reflux disease)     Past Surgical History:  Procedure Laterality Date  . TRANSTHORACIC ECHOCARDIOGRAM  05/18/2008   normal LVF/ mild LVH/trace mitral and tricuspid regurgitation   Family History:  Family History  Problem Relation Age of Onset  . Hypertension Father   . Cancer Mother    Family Psychiatric  History: unknown Social History:  History  Alcohol Use  . Yes    Comment: 5th a day     History   Drug Use No    Social History   Social History  . Marital status: Married    Spouse name: N/A  . Number of children: N/A  . Years of education: N/A   Social History Main Topics  . Smoking status: Former Research scientist (life sciences)  . Smokeless tobacco: Never Used     Comment: Pt stated "I vape"  . Alcohol use Yes     Comment: 5th a day  . Drug use: No  . Sexual activity: Not Asked   Other Topics Concern  . None   Social History Narrative  . None   Additional Social History:    Allergies:  No Known Allergies  Labs:  Results for orders placed or performed during the hospital encounter of 06/10/16 (from the past 48 hour(s))  Comprehensive metabolic panel     Status: Abnormal   Collection Time: 06/10/16  9:05 PM  Result Value Ref Range   Sodium 141 135 - 145 mmol/L   Potassium 3.3 (L) 3.5 - 5.1 mmol/L   Chloride 101 101 - 111 mmol/L   CO2 25 22 - 32 mmol/L   Glucose, Bld 92 65 - 99 mg/dL   BUN 16 6 - 20 mg/dL   Creatinine, Ser 0.98 0.61 - 1.24 mg/dL   Calcium 9.2 8.9 - 10.3 mg/dL   Total Protein 7.6 6.5 - 8.1 g/dL   Albumin 4.7 3.5 - 5.0 g/dL   AST 32 15 - 41 U/L   ALT 34 17 - 63 U/L   Alkaline Phosphatase 67  38 - 126 U/L   Total Bilirubin 0.9 0.3 - 1.2 mg/dL   GFR calc non Af Amer >60 >60 mL/min   GFR calc Af Amer >60 >60 mL/min    Comment: (NOTE) The eGFR has been calculated using the CKD EPI equation. This calculation has not been validated in all clinical situations. eGFR's persistently <60 mL/min signify possible Chronic Kidney Disease.    Anion gap 15 5 - 15  Ethanol     Status: Abnormal   Collection Time: 06/10/16  9:05 PM  Result Value Ref Range   Alcohol, Ethyl (B) 171 (H) <5 mg/dL    Comment:        LOWEST DETECTABLE LIMIT FOR SERUM ALCOHOL IS 5 mg/dL FOR MEDICAL PURPOSES ONLY   cbc     Status: Abnormal   Collection Time: 06/10/16  9:05 PM  Result Value Ref Range   WBC 14.0 (H) 4.0 - 10.5 K/uL   RBC 5.29 4.22 - 5.81 MIL/uL   Hemoglobin 16.5 13.0 - 17.0 g/dL    HCT 46.3 39.0 - 52.0 %   MCV 87.5 78.0 - 100.0 fL   MCH 31.2 26.0 - 34.0 pg   MCHC 35.6 30.0 - 36.0 g/dL   RDW 13.3 11.5 - 15.5 %   Platelets 260 150 - 400 K/uL  Rapid urine drug screen (hospital performed)     Status: None   Collection Time: 06/10/16  9:05 PM  Result Value Ref Range   Opiates NONE DETECTED NONE DETECTED   Cocaine NONE DETECTED NONE DETECTED   Benzodiazepines NONE DETECTED NONE DETECTED   Amphetamines NONE DETECTED NONE DETECTED   Tetrahydrocannabinol NONE DETECTED NONE DETECTED   Barbiturates NONE DETECTED NONE DETECTED    Comment:        DRUG SCREEN FOR MEDICAL PURPOSES ONLY.  IF CONFIRMATION IS NEEDED FOR ANY PURPOSE, NOTIFY LAB WITHIN 5 DAYS.        LOWEST DETECTABLE LIMITS FOR URINE DRUG SCREEN Drug Class       Cutoff (ng/mL) Amphetamine      1000 Barbiturate      200 Benzodiazepine   132 Tricyclics       440 Opiates          300 Cocaine          300 THC              50     Current Facility-Administered Medications  Medication Dose Route Frequency Provider Last Rate Last Dose  . acetaminophen (TYLENOL) tablet 650 mg  650 mg Oral Q4H PRN Ankit Nanavati, MD      . DULoxetine (CYMBALTA) DR capsule 40 mg  40 mg Oral Daily Varney Biles, MD   40 mg at 06/11/16 0945  . gabapentin (NEURONTIN) capsule 300 mg  300 mg Oral QHS Varney Biles, MD   300 mg at 06/10/16 2211  . LORazepam (ATIVAN) tablet 0-4 mg  0-4 mg Oral Q6H Ankit Nanavati, MD   1 mg at 06/11/16 0945   Followed by  . [START ON 06/12/2016] LORazepam (ATIVAN) tablet 0-4 mg  0-4 mg Oral Q12H Ankit Nanavati, MD      . LORazepam (ATIVAN) tablet 1 mg  1 mg Oral Q8H PRN Ankit Nanavati, MD      . ondansetron (ZOFRAN) tablet 4 mg  4 mg Oral Q8H PRN Varney Biles, MD   4 mg at 06/10/16 2211  . thiamine (VITAMIN B-1) tablet 100 mg  100 mg Oral Daily Varney Biles, MD  100 mg at 06/11/16 0946   Or  . thiamine (B-1) injection 100 mg  100 mg Intravenous Daily Varney Biles, MD       Current  Outpatient Prescriptions  Medication Sig Dispense Refill  . DULoxetine HCl 40 MG CPEP Take 40 mg by mouth daily. For depression 30 capsule 0  . gabapentin (NEURONTIN) 300 MG capsule Take 1 capsule (300 mg total) by mouth at bedtime. For agitation 90 capsule 0  . hydrOXYzine (ATARAX/VISTARIL) 25 MG tablet Take 1 tablet (25 mg total) by mouth every 6 (six) hours as needed for anxiety. (Patient not taking: Reported on 06/10/2016) 60 tablet 0  . naltrexone (DEPADE) 50 MG tablet Take 0.5 tablets (25 mg total) by mouth daily. For alcohol cravings (Patient not taking: Reported on 06/10/2016) 30 tablet 0    Musculoskeletal: Strength & Muscle Tone: within normal limits Gait & Station: normal Patient leans: N/A  Psychiatric Specialty Exam: Physical Exam  Constitutional: He is oriented to person, place, and time. He appears well-developed and well-nourished.  HENT:  Head: Normocephalic.  Neck: Normal range of motion.  Respiratory: Effort normal.  Musculoskeletal: Normal range of motion.  Neurological: He is alert and oriented to person, place, and time.  Psychiatric: His speech is normal and behavior is normal. Judgment and thought content normal. Cognition and memory are normal. He exhibits a depressed mood.    Review of Systems  Psychiatric/Behavioral: Positive for depression and substance abuse.  All other systems reviewed and are negative.   Blood pressure 122/75, pulse 93, temperature 97.8 F (36.6 C), temperature source Oral, resp. rate 18, height 5' 10"  (1.778 m), weight 108.9 kg (240 lb), SpO2 99 %.Body mass index is 34.44 kg/m.  General Appearance: Casual  Eye Contact:  Good  Speech:  Normal Rate  Volume:  Normal  Mood:  Depressed  Affect:  Congruent  Thought Process:  Coherent and Descriptions of Associations: Intact  Orientation:  Full (Time, Place, and Person)  Thought Content:  WDL  Suicidal Thoughts:  No  Homicidal Thoughts:  No  Memory:  Immediate;   Fair Recent;    Fair Remote;   Fair  Judgement:  Fair  Insight:  Fair  Psychomotor Activity:  Decreased  Concentration:  Concentration: Fair and Attention Span: Fair  Recall:  [employer] of Knowledge:  Fair  Language:  Good  Akathisia:  No  Handed:  Right  AIMS (if indicated):     Assets:  Leisure Time Physical Health Resilience  ADL's:  Intact  Cognition:  WNL  Sleep:        Treatment Plan Summary: Daily contact with patient to assess and evaluate symptoms and progress in treatment, Medication management and Plan alcohol abuse with alcohol induced mood disorder;  -Crisis stabilization -Medication management:  Ativan alcohol detox protocol started along with Cymbalta 40 mg daily for depression and gabapentin 300 mg at bedtime for mood stabilization -Individual and substance abuse counseling  Disposition: Supportive therapy provided about ongoing stressors.  Waylan Boga, NP 06/11/2016 10:32 AM  Patient seen face-to-face for psychiatric evaluation, chart reviewed and case discussed with the physician extender and developed treatment plan. Reviewed the information documented and agree with the treatment plan. Corena Pilgrim, MD

## 2016-06-11 NOTE — ED Provider Notes (Signed)
[facility]-EMERGENCY DEPT Provider Note   CSN: [id number] Arrival date & time: 06/10/16  2008     History   Chief Complaint Chief Complaint  Patient presents with  . Alcohol Intoxication    HPI Scott Chandler is a 41 y.o. male.  HPI Pt comes in with cc of alcohol abuse and suicidal ideations. He has hx of alcoholism and GERD, depression. Pt here with his ex-wife. Pt reports that he feels really down since he "abandoned" his wife and has been having suicidal thoughts. His plan was to drink himself to death. Wife reports that pt went "MIA" for 5 days, and finally texted her that he was in a motel in [place]. She brought him here for treatment, and pt ran out and went into the woods. Pt denies drug use. Pt denies nausea, emesis, fevers, chills, chest pains, shortness of breath, headaches, abdominal pain, uti like symptoms.   Past Medical History:  Diagnosis Date  . Chest pain   . Depression   . GERD (gastroesophageal reflux disease)     Patient Active Problem List   Diagnosis Date Noted  . Alcohol use disorder, severe, dependence (HCC) 11/18/2015  . Alcohol abuse with alcohol-induced mood disorder (HCC) 11/14/2015  . EPIGASTRIC PAIN 10/22/2009  . FECAL OCCULT BLOOD 10/22/2009  . HYPERLIPIDEMIA 10/21/2009  . ESOPHAGITIS, HX OF 10/21/2009  . GERD 05/31/2008  . CHEST PAIN 05/31/2008  . DIARRHEA 05/31/2008    Past Surgical History:  Procedure Laterality Date  . TRANSTHORACIC ECHOCARDIOGRAM  05/18/2008   normal LVF/ mild LVH/trace mitral and tricuspid regurgitation       Home Medications    Prior to Admission medications   Medication Sig Start Date End Date Taking? Authorizing Provider  DULoxetine HCl 40 MG CPEP Take 40 mg by mouth daily. For depression 12/30/15  Yes Sanjuana KavaAgnes I Nwoko, NP  gabapentin (NEURONTIN) 300 MG capsule Take 1 capsule (300 mg total) by mouth at bedtime. For agitation 12/30/15  Yes Sanjuana KavaAgnes I Nwoko, NP  hydrOXYzine (ATARAX/VISTARIL) 25 MG tablet Take 1 tablet  (25 mg total) by mouth every 6 (six) hours as needed for anxiety. Patient not taking: Reported on 06/10/2016 12/30/15   Sanjuana KavaAgnes I Nwoko, NP  naltrexone (DEPADE) 50 MG tablet Take 0.5 tablets (25 mg total) by mouth daily. For alcohol cravings Patient not taking: Reported on 06/10/2016 12/30/15   Sanjuana KavaAgnes I Nwoko, NP    Family History Family History  Problem Relation Age of Onset  . Hypertension Father   . Cancer Mother     Social History Social History  Substance Use Topics  . Smoking status: Former Games developermoker  . Smokeless tobacco: Never Used     Comment: Pt stated "I vape"  . Alcohol use Yes     Comment: 5th a day     Allergies   Patient has no known allergies.   Review of Systems Review of Systems  Psychiatric/Behavioral: Positive for suicidal ideas.   ROS 10 Systems reviewed and are negative for acute change except as noted in the HPI.    Physical Exam Updated Vital Signs BP 136/82 (BP Location: Right Arm)   Pulse 112   Temp 98.4 F (36.9 C) (Oral)   Resp 22   Ht 5\' 10"  (1.778 m)   Wt 240 lb (108.9 kg)   SpO2 99%   BMI 34.44 kg/m   Physical Exam  Constitutional: He is oriented to person, place, and time. He appears well-developed.  HENT:  Head: Normocephalic and atraumatic.  Eyes: Conjunctivae  and EOM are normal. Pupils are equal, round, and reactive to light.  Neck: Normal range of motion. Neck supple.  Cardiovascular: Normal rate and regular rhythm.   Pulmonary/Chest: Effort normal and breath sounds normal.  Abdominal: Soft. Bowel sounds are normal. He exhibits no distension and no mass. There is no tenderness. There is no rebound and no guarding.  Musculoskeletal: He exhibits no deformity.  Neurological: He is alert and oriented to person, place, and time.  Skin: Skin is warm. Rash noted.  Psychiatric: He has a normal mood and affect.  labile  Nursing note and vitals reviewed.    ED Treatments / Results  Labs (all labs ordered are listed, but only abnormal  results are displayed) Labs Reviewed  COMPREHENSIVE METABOLIC PANEL - Abnormal; Notable for the following:       Result Value   Potassium 3.3 (*)    All other components within normal limits  ETHANOL - Abnormal; Notable for the following:    Alcohol, Ethyl (B) 171 (*)    All other components within normal limits  CBC - Abnormal; Notable for the following:    WBC 14.0 (*)    All other components within normal limits  RAPID URINE DRUG SCREEN, HOSP PERFORMED    EKG  EKG Interpretation None       Radiology No results found.  Procedures Procedures (including critical care time)  Medications Ordered in ED Medications  LORazepam (ATIVAN) tablet 1 mg (not administered)  acetaminophen (TYLENOL) tablet 650 mg (not administered)  ondansetron (ZOFRAN) tablet 4 mg (4 mg Oral Given 06/10/16 2211)  LORazepam (ATIVAN) tablet 0-4 mg (1 mg Oral Given 06/10/16 2211)    Followed by  LORazepam (ATIVAN) tablet 0-4 mg (not administered)  thiamine (VITAMIN B-1) tablet 100 mg (100 mg Oral Given 06/10/16 2209)    Or  thiamine (B-1) injection 100 mg ( Intravenous See Alternative 06/10/16 2209)  gabapentin (NEURONTIN) capsule 300 mg (300 mg Oral Given 06/10/16 2211)  DULoxetine (CYMBALTA) DR capsule 40 mg (40 mg Oral Given 06/10/16 2210)     Initial Impression / Assessment and Plan / ED Course  I have reviewed the triage vital signs and the nursing notes.  Pertinent labs & imaging results that were available during my care of the patient were reviewed by me and considered in my medical decision making (see chart for details).    Pt comes in with cc of alcoholism. He also reports some passive suicidal thoughts.  We will consult TTS. CIWA started. No signs of any withdrawals at this time.   Final Clinical Impressions(s) / ED Diagnoses   Final diagnoses:  Alcohol abuse  Suicidal ideation    New Prescriptions New Prescriptions   No medications on file     Derwood Kaplan, MD 06/11/16  [phone]

## 2016-06-12 ENCOUNTER — Encounter (HOSPITAL_COMMUNITY): Payer: Self-pay

## 2016-06-12 DIAGNOSIS — F1014 Alcohol abuse with alcohol-induced mood disorder: Secondary | ICD-10-CM

## 2016-06-12 DIAGNOSIS — Z87891 Personal history of nicotine dependence: Secondary | ICD-10-CM

## 2016-06-12 MED ORDER — GABAPENTIN 300 MG PO CAPS: 300.0000 mg | ORAL_CAPSULE | Freq: Every day | ORAL | 0 refills | Status: DC

## 2016-06-12 MED ORDER — DULOXETINE HCL 20 MG PO CPEP: ORAL_CAPSULE | ORAL | 3 refills | Status: DC

## 2016-06-12 MED ORDER — DULOXETINE HCL 40 MG PO CPEP: 40.0000 mg | ORAL_CAPSULE | Freq: Every day | ORAL | 0 refills | Status: DC

## 2016-06-12 MED ORDER — HYDROXYZINE HCL 25 MG PO TABS: 25.0000 mg | ORAL_TABLET | Freq: Four times a day (QID) | ORAL | 0 refills | Status: DC | PRN

## 2016-06-12 NOTE — Progress Notes (Signed)
D/C instructions/transition record/suicide risk assessment/meds/follow-up appointments reviewed and given to patient, pt verbalized understanding, pt's belongings returned to pt, denies SI/HI/AVH. 

## 2016-06-12 NOTE — Discharge Summary (Signed)
[facility] Discharge Summary Note  Patient:  Scott ComberJason Gadison is an 41 y.o., male MRN:  [id number] DOB:  04-Dec-1975 Patient phone:  [phone] (home)  Patient address:   [address] [place] [id number],  Total Time spent with patient: 30 minutes  Date of Admission:  06/11/2016 Date of Discharge:  06/12/2016  Reason for Admission:  Alcohol Induced Mood Disorder  Principal Problem: Alcohol abuse with alcohol-induced mood disorder [facility](HCC) Discharge Diagnoses: Patient Active Problem List   Diagnosis Date Noted  . Alcohol abuse with alcohol-induced mood disorder (HCC) [F10.14] 11/14/2015    Priority: High  . EPIGASTRIC PAIN [R10.13] 10/22/2009  . FECAL OCCULT BLOOD [R19.5] 10/22/2009  . HYPERLIPIDEMIA [E78.5] 10/21/2009  . ESOPHAGITIS, HX OF [Z87.19] 10/21/2009  . GERD [K21.9] 05/31/2008  . CHEST PAIN [R07.9] 05/31/2008  . DIARRHEA [R19.7] 05/31/2008    Past Psychiatric History:   Past Medical History:  Past Medical History:  Diagnosis Date  . Chest pain   . Depression   . GERD (gastroesophageal reflux disease)     Past Surgical History:  Procedure Laterality Date  . TRANSTHORACIC ECHOCARDIOGRAM  05/18/2008   normal LVF/ mild LVH/trace mitral and tricuspid regurgitation   Family History:  Family History  Problem Relation Age of Onset  . Hypertension Father   . Cancer Mother    Family Psychiatric  History: Unknown Social History:  History  Alcohol Use  . Yes    Comment: 5th a day     History  Drug Use No    Social History   Social History  . Marital status: Married    Spouse name: N/A  . Number of children: N/A  . Years of education: N/A   Occupational History  . Unemployed    Social History Main Topics  . Smoking status: Former Games developermoker  . Smokeless tobacco: Never Used     Comment: Pt stated "I vape"  . Alcohol use Yes     Comment: 5th a day  . Drug use: No  . Sexual activity: Yes    Partners: Male   Other Topics Concern  . None    Social History Narrative   Pt lost his job and is separated from his wife.    Hospital Course:  Scott ComberJason Blick is a 41 year old male who [facility]. Pt has a long history of alcohol induced mood disorder and wishes to seek long term rehabilitation as an outpatient. Pt spent the night in the OBS unit without incident.  Pt denies suicidal/homicidal ideation, denies auditory/visual hallucinations and does not appear to be responding to internal stimuli. Pt was calm and cooperative, alert & oriented x 3, affect was appropriate with staff and for the situation. Pt was unable to secure a treatment bed today and will discharge home with his wife and continue to seek placement. Pt left the OBS unit ambulatory, with all belongings and in stable condition. The following prescriptions were provided:  Vistaril 25 mg q6h prn  Cymbalta 40 mg PO QD  Gabapentin 300 mg PO QHS     Physical Findings: AIMS:  , ,  ,  ,    CIWA:  CIWA-Ar Total: 4 COWS:     Musculoskeletal: Strength & Muscle Tone: within normal limits Gait & Station: normal Patient leans: N/A  Psychiatric Specialty Exam: Physical Exam  Review of Systems  Psychiatric/Behavioral: Positive for depression and substance abuse. Negative for hallucinations, memory loss and suicidal  ideas. The patient is not nervous/anxious and does not have insomnia.     Blood pressure (!) 142/82, pulse 75, temperature 98.3 F (36.8 C), temperature source Oral, resp. rate 16, height 5\' 10"  (1.778 m), weight 103 kg (227 lb 1.2 oz), SpO2 100 %.Body mass index is 32.58 kg/m.  General Appearance: Casual and Fairly Groomed  Eye Contact:  Good  Speech:  Clear and Coherent and Normal Rate  Volume:  Normal  Mood:  Depressed  Affect:  Congruent and Depressed  Thought Process:  Coherent, Goal Directed and Linear  Orientation:  Full (Time, Place, and Person)  Thought Content:  Logical  Suicidal Thoughts:  No   Homicidal Thoughts:  No  Memory:  Immediate;   Good Recent;   Good Remote;   Fair  Judgement:  Fair  Insight:  Fair  Psychomotor Activity:  Normal  Concentration:  Concentration: Good and Attention Span: Good  Recall:  Good  Fund of Knowledge:  Good  Language:  Good  Akathisia:  No  Handed:  Right  AIMS (if indicated):     Assets:  Communication Skills Desire for Improvement Financial Resources/Insurance Housing Physical Health Resilience Social Support Vocational/Educational  ADL's:  Intact  Cognition:  WNL  Sleep:           Has this patient used any form of tobacco in the last 30 days? (Cigarettes, Smokeless Tobacco, Cigars, and/or Pipes) Yes, No  Blood Alcohol level:  Lab Results  Component Value Date   ETH 171 (H) 06/10/2016   ETH 147 (H) 12/26/2015    Metabolic Disorder Labs:  No results found for: HGBA1C, MPG No results found for: PROLACTIN No results found for: CHOL, TRIG, HDL, CHOLHDL, VLDL, LDLCALC  See Psychiatric Specialty Exam and Suicide Risk Assessment completed by Attending Physician prior to discharge.  Discharge destination:  Home  Is patient on multiple antipsychotic therapies at discharge:  No   Has Patient had three or more failed trials of antipsychotic monotherapy by history:  No  Recommended Plan for Multiple Antipsychotic Therapies: NA   Allergies as of 06/12/2016   No Known Allergies     Medication List    STOP taking these medications   naltrexone 50 MG tablet Commonly known as:  DEPADE     TAKE these medications     Indication  DULoxetine HCl 40 MG Cpep Take 40 mg by mouth daily. Start taking on:  06/13/2016 What changed:  additional instructions  Indication:  Major Depressive Disorder   gabapentin 300 MG capsule Commonly known as:  NEURONTIN Take 1 capsule (300 mg total) by mouth at bedtime. What changed:  additional instructions  Indication:  Agitation   hydrOXYzine 25 MG tablet Commonly known as:   ATARAX/VISTARIL Take 1 tablet (25 mg total) by mouth every 6 (six) hours as needed (anxiety/agitation or CIWA < or = 10). What changed:  reasons to take this  Indication:  Anxiety Neurosis        Follow-up recommendations:  Activity:  As tolerated Eat a balanced and nutritious diet  Refrain from alcohol ingestion Follow up with outpatient resources as provided to you Follow up with PCP for any ongoing or new medical issues. Attend AA or local support group for assistance with alcoholism Maintain medication compliance, take all medications as directed  Comments:  Discharge home with outpatient resources for alcohol treatment and therapy  Signed: Laveda Abbe, NP 06/12/2016, 10:20 AM

## 2016-06-14 ENCOUNTER — Ambulatory Visit (HOSPITAL_COMMUNITY)
Admission: AD | Admit: 2016-06-14 | Discharge: 2016-06-14 | Disposition: A | Discharge status: Home / Self Care | Payer: BLUE CROSS/BLUE SHIELD | Attending: Psychiatry | Admitting: Psychiatry

## 2016-06-14 ENCOUNTER — Emergency Department (HOSPITAL_COMMUNITY)
Admission: EM | Admit: 2016-06-14 | Discharge: 2016-06-14 | Discharge status: Absent without leave | Payer: BLUE CROSS/BLUE SHIELD

## 2016-06-14 DIAGNOSIS — R45851 Suicidal ideations: Secondary | ICD-10-CM | POA: Insufficient documentation

## 2016-06-14 DIAGNOSIS — F329 Major depressive disorder, single episode, unspecified: Secondary | ICD-10-CM | POA: Insufficient documentation

## 2016-06-14 DIAGNOSIS — F419 Anxiety disorder, unspecified: Secondary | ICD-10-CM | POA: Insufficient documentation

## 2016-06-14 DIAGNOSIS — G4700 Insomnia, unspecified: Secondary | ICD-10-CM | POA: Insufficient documentation

## 2016-06-14 DIAGNOSIS — R1013 Epigastric pain: Secondary | ICD-10-CM | POA: Insufficient documentation

## 2016-06-14 DIAGNOSIS — Z79899 Other long term (current) drug therapy: Secondary | ICD-10-CM | POA: Insufficient documentation

## 2016-06-14 DIAGNOSIS — F1094 Alcohol use, unspecified with alcohol-induced mood disorder: Secondary | ICD-10-CM | POA: Insufficient documentation

## 2016-06-14 DIAGNOSIS — F1910 Other psychoactive substance abuse, uncomplicated: Secondary | ICD-10-CM | POA: Insufficient documentation

## 2016-06-14 DIAGNOSIS — Z87891 Personal history of nicotine dependence: Secondary | ICD-10-CM | POA: Insufficient documentation

## 2016-06-14 NOTE — B[facility] Counselor (Signed)
Clinician spoke to [facility], Press photographerCharge Nurse; expressed pt [facility] and [facility].  Gwinda Passereylese D Bennett, MS, [facility], [facility] Triage Specialist [phone]

## 2016-06-14 NOTE — H&P (Signed)
Behavioral Health Medical Screening Exam  Scott ComberJason Chandler is an 41 y.o. male.  Total Time spent with patient: 15 minutes  Psychiatric Specialty Exam: Physical Exam  Constitutional: He is oriented to person, place, and time. He appears well-developed and well-nourished. No distress.  HENT:  Head: Normocephalic and atraumatic.  Right Ear: External ear normal.  Left Ear: External ear normal.  Eyes: Pupils are equal, round, and reactive to light. Right eye exhibits no discharge. Left eye exhibits no discharge. No scleral icterus.  Neck: Normal range of motion.  Cardiovascular: Normal rate, regular rhythm and normal heart sounds.   Respiratory: Effort normal and breath sounds normal. No respiratory distress.  Musculoskeletal: Normal range of motion.  Neurological: He is alert and oriented to person, place, and time.  Skin: Skin is warm and dry. He is not diaphoretic.  Psychiatric: His speech is normal. His mood appears anxious. He is not hyperactive, not withdrawn and not actively hallucinating. Thought content is not paranoid and not delusional. Cognition and memory are normal. He expresses impulsivity and inappropriate judgment. He exhibits a depressed mood. He expresses suicidal ideation. He expresses no homicidal ideation. He expresses suicidal plans.    Review of Systems  Psychiatric/Behavioral: Positive for depression, substance abuse and suicidal ideas. Negative for hallucinations and memory loss. The patient is nervous/anxious and has insomnia.   All other systems reviewed and are negative.   Blood pressure (!) 145/96, pulse 92, temperature 98.6 F (37 C), resp. rate 18, SpO2 100 %.There is no height or weight on file to calculate BMI.  General Appearance: Disheveled  Eye Contact:  Fair  Speech:  Clear and Coherent and Normal Rate  Volume:  Normal  Mood:  Anxious, Depressed, Hopeless, Irritable and Worthless  Affect:  Congruent and Depressed  Thought Process:  Coherent  Orientation:   Full (Time, Place, and Person)  Thought Content:  Logical and Hallucinations: None  Suicidal Thoughts:  Yes.  with intent/plan  Homicidal Thoughts:  No  Memory:  Immediate;   Good Recent;   Good Remote;   Good  Judgement:  Fair  Insight:  Fair  Psychomotor Activity:  Normal  Concentration: Concentration: Fair and Attention Span: Fair  Recall:  Good  Fund of Knowledge:Good  Language: Good  Akathisia:  No  Handed:  Right  AIMS (if indicated):     Assets:  Communication Skills Desire for Improvement Financial Resources/Insurance Physical Health  Sleep:       Musculoskeletal: Strength & Muscle Tone: within normal limits Gait & Station: normal   Blood pressure (!) 145/96, pulse 92, temperature 98.6 F (37 C), resp. rate 18, SpO2 100 %.  Recommendations:  Based on my evaluation the patient does not appear to have an emergency medical condition. Patient will not contract for safety outside the hospital.   Jackelyn PolingJason A Rochell Mabie, NP 06/14/2016, 10:45 PM

## 2016-06-14 NOTE — ED Notes (Signed)
Entered in error

## 2016-06-14 NOTE — BH Assessment (Addendum)
Tele Assessment Note   Scott Chandler is an 41 y.o. male, who presents voluntarily and accompanied by his wife. Pt reported, he was discharged from [facility] Observations Unit on Friday (06/12/2016). Pt reported, as soon as he was discharged he was he started drinking. Pt's wife reported, an hour before coming to [facility], the pt reported, wanting to drink himself to death. Pt reported, when he is sober he feels he would do anything. Pt reported, experiencing "mild hallucinations." Pt reported, the following triggers: his cats, wife, daughter being in a group home, "girlfriend," and not having money or a job. Pt denies, HI, and self-injurious behaviors.    Pt's wife reported, the pt can be verbally abusive. Pt reported, drinking liquor but he is unsure of the amount. Pt denied being linked to OPT resources (medication management and/or counseling.) Pt denied previous substance abuse treatment. Pt reported, previous inpatient treatment at [facility] in June/July 2017 for attempting to hang himself.   Pt presented quiet/awake disheveled with logical/coherent speech. Pt's eye contact was poor. Pt's mood was depressed/anxious. Pt's affect was appropriate to circumstance. Pt's thought process is coherent/relevant. Pt's judgement is impaired. Pt's concentration was fair. Pt's insight and impulse control are poor. Pt is oriented x4 (date, year, city and state.) Clinician discussed the three possible dispositions (discharge with resources, AM Psychiatric Evaluation and inpatient treatment) Pt reported, if inpatient treatment is recommended he would sign in voluntarily. Pt reported, if she is discharged from [facility] he would come right back.   Diagnosis: Alcohol Use Disorder, Severe                   Major Depressive Disorder, Recurrent, Severe, with Psychotic Features.  Past Medical History:  Past Medical History:  Diagnosis Date  . Chest pain   . Depression   . GERD (gastroesophageal reflux disease)     Past  Surgical History:  Procedure Laterality Date  . TRANSTHORACIC ECHOCARDIOGRAM  05/18/2008   normal LVF/ mild LVH/trace mitral and tricuspid regurgitation    Family History:  Family History  Problem Relation Age of Onset  . Hypertension Father   . Cancer Mother     Social History:  reports that he has quit smoking. He has never used smokeless tobacco. He reports that he drinks alcohol. He reports that he does not use drugs.  Additional Social History:  Alcohol / Drug Use Pain Medications: See MAR Prescriptions: See MAR Over the Counter: See MAR Substance #1 Name of Substance 1: Alcohol  1 - Age of First Use: UTA 1 - Amount (size/oz): Pt reportedn dirnking liquor since he was discharged from [facility] on Friday. Pt reported, drinking beers to come to [facility].  1 - Frequency: Pt reported, daily.  1 - Duration: UTA  1 - Last Use / Amount: Pt reported, today.   CIWA: CIWA-Ar BP: (!) 145/96 Pulse Rate: 92 COWS:    PATIENT STRENGTHS: (choose at least two) Average or above average intelligence Supportive family/friends  Allergies: No Known Allergies  Home Medications:  (Not in a hospital admission)  OB/GYN Status:  No LMP for male patient.  General Assessment Data [facility]: [facility] TTS Assessment: In system Is this a Tele or Face-to-Face Assessment?: Face-to-Face Is this an Initial Assessment or a Re-assessment for this encounter?: Initial Assessment Marital status: Separated Is patient pregnant?: No Pregnancy Status: No Living Arrangements: Other (Comment) (Homeless) Can pt return to current living arrangement?: Yes Admission Status: Voluntary Is patient capable  of signing voluntary admission?: Yes Referral Source: Self/Family/Friend Insurance type: BCBS  Medical Screening Exam [facility] Walk-in ONLY) Medical Exam completed: Yes  Crisis Care Plan Living Arrangements: Other (Comment) (Homeless) Legal Guardian: Other: (Self) Name of Psychiatrist: NA Name  of Therapist: NA  Education Status Is patient currently in school?: No Current Grade: NA Highest grade of school patient has completed: Pt reported, [employer]'s degree. Name of school: NA Contact person: NA  Risk to self with the past 6 months Suicidal Ideation: Yes-Currently Present Has patient been a risk to self within the past 6 months prior to admission? : Yes Suicidal Intent: Yes-Currently Present Has patient had any suicidal intent within the past 6 months prior to admission? : Yes Is patient at risk for suicide?: Yes Suicidal Plan?: Yes-Currently Present Has patient had any suicidal plan within the past 6 months prior to admission? : Yes Specify Current Suicidal Plan: Pt reported, wanting to drinking himself to death. Access to Means: Yes Specify Access to Suicidal Means: Pt has access to alcohol.  What has been your use of drugs/alcohol within the last 12 months?: Alcohol Previous Attempts/Gestures: Yes How many times?: 1 Other Self Harm Risks: Pt denies.  Triggers for Past Attempts: Spouse contact, Family contact, Unpredictable Intentional Self Injurious Behavior: None (Pt denies. ) Family Suicide History: Unknown Recent stressful life event(s): Financial Problems (Pt reported, his wife, daughter, "girlfriend",no money/job.) Persecutory voices/beliefs?: No Depression: Yes Depression Symptoms: Tearfulness, Isolating, Guilt, Loss of interest in usual pleasures, Feeling worthless/self pity, Feeling angry/irritable Substance abuse history and/or treatment for substance abuse?: Yes Suicide prevention information given to non-admitted patients: Not applicable  Risk to Others within the past 6 months Homicidal Ideation: No (Pt denies. ) Does patient have any lifetime risk of violence toward others beyond the six months prior to admission? : No Thoughts of Harm to Others: No Current Homicidal Intent: No Current Homicidal Plan: No Access to Homicidal Means: No Identified  Victim: NA History of harm to others?: No Assessment of Violence: None Noted Violent Behavior Description: NA Does patient have access to weapons?: No (Pt denies. ) Criminal Charges Pending?: No Does patient have a court date: No Is patient on probation?: No  Psychosis Hallucinations: Auditory Delusions: None noted  Mental Status Report Appearance/Hygiene: Disheveled Eye Contact: Poor Motor Activity: Unremarkable Speech: Logical/coherent Level of Consciousness: Quiet/awake Mood: Depressed, Anxious Affect: Appropriate to circumstance Anxiety Level: Minimal Thought Processes: Coherent, Relevant Judgement: Impaired Obsessive Compulsive Thoughts/Behaviors: None  Cognitive Functioning Concentration: Fair Memory: Recent Intact IQ: Average Insight: Poor Appetite: Poor Weight Loss: 0 Weight Gain: 0 Sleep: No Change Total Hours of Sleep:  (Pt reported, "so/so." ) Vegetative Symptoms: Not bathing, Staying in bed  ADLScreening [facility] Assessment Services) Patient's cognitive ability adequate to safely complete daily activities?: Yes Patient able to express need for assistance with ADLs?: Yes Independently performs ADLs?: Yes (appropriate for developmental age) (Pt's wife reported, the pt pooped himself [facility] )  Prior Inpatient Therapy Prior Inpatient Therapy: Yes Prior Therapy Dates: June/July 2017 Prior [facility] Reason for Treatment: Plan to hang himself.   Prior Outpatient Therapy Prior Outpatient Therapy: No Prior Therapy Dates: NA Prior Therapy Facilty/Provider(s): NA Reason for Treatment: NA Does patient have an ACCT team?: No Does patient have Intensive In-House Services?  : No Does patient have [facility] services? : No Does patient have P4CC services?: No  ADL Screening (condition at time of admission) Patient's cognitive ability adequate to safely complete daily activities?: Yes  Is the patient deaf or have difficulty  hearing?: No Does the patient have difficulty seeing, even when wearing glasses/contacts?: Yes (Pt reported, wearing contacts. ) Does the patient have difficulty concentrating, remembering, or making decisions?: Yes Patient able to express need for assistance with ADLs?: Yes Does the patient have difficulty dressing or bathing?: No Independently performs ADLs?: Yes (appropriate for developmental age) (Pt's wife reported, the pt pooped himself on the way to [facility]. ) Does the patient have difficulty walking or climbing stairs?: No Weakness of Legs: None Weakness of Arms/Hands: None       Abuse/Neglect Assessment (Assessment to be complete while patient is alone) Physical Abuse: Denies (Pt denies. ) Verbal Abuse: Denies (Pt's wife reported, pt becomes verbally abusive. ) Sexual Abuse: Denies (Pt denies. )     Advance Directives (For Healthcare) Does Patient Have a Medical Advance Directive?: No Would patient like information on creating a medical advance directive?: Yes (Inpatient - patient requests chaplain consult to create a medical advance directive)    Additional Information 1:1 In Past 12 Months?: No CIRT Risk: No Elopement Risk: No Does patient have medical clearance?: No     Disposition: Nira Conn, NP recommends AM Psychiatric Evaluation. Disposition was discussed with Jimmy Picket Nurse and pt is being transferred for medical clearance. Disposition Initial Assessment Completed for this Encounter: Yes Disposition of Patient: Other dispositions (AM Psychiatric Evaluation ) Other disposition(s): Other (Comment) (AM Psychiaric Evaluation)  Gwinda Passe 06/14/2016 11:04 PM   Gwinda Passe, MS, [facility], [facility] Triage Specialist [phone]

## 2016-06-15 ENCOUNTER — Encounter (HOSPITAL_COMMUNITY): Payer: Self-pay | Admitting: Emergency Medicine

## 2016-06-15 ENCOUNTER — Encounter (HOSPITAL_COMMUNITY): Payer: Self-pay

## 2016-06-15 ENCOUNTER — Emergency Department (HOSPITAL_COMMUNITY)
Admission: EM | Admit: 2016-06-15 | Discharge: 2016-06-15 | Disposition: A | Discharge status: Other Acute Inpatient Hospital | Payer: BLUE CROSS/BLUE SHIELD | Attending: Emergency Medicine | Admitting: Emergency Medicine

## 2016-06-15 ENCOUNTER — Inpatient Hospital Stay (HOSPITAL_COMMUNITY)
Admission: AD | Admit: 2016-06-15 | Discharge: 2016-06-18 | DRG: 885 | Disposition: A | Discharge status: Home / Self Care | Payer: Federal, State, Local not specified - Other | Source: Intra-hospital | Attending: Psychiatry | Admitting: Psychiatry

## 2016-06-15 DIAGNOSIS — F10239 Alcohol dependence with withdrawal, unspecified: Secondary | ICD-10-CM | POA: Diagnosis present

## 2016-06-15 DIAGNOSIS — F17290 Nicotine dependence, other tobacco product, uncomplicated: Secondary | ICD-10-CM | POA: Diagnosis present

## 2016-06-15 DIAGNOSIS — F332 Major depressive disorder, recurrent severe without psychotic features: Principal | ICD-10-CM | POA: Diagnosis present

## 2016-06-15 DIAGNOSIS — F411 Generalized anxiety disorder: Secondary | ICD-10-CM | POA: Diagnosis present

## 2016-06-15 DIAGNOSIS — IMO0002 Reserved for concepts with insufficient information to code with codable children: Secondary | ICD-10-CM | POA: Diagnosis present

## 2016-06-15 DIAGNOSIS — R45851 Suicidal ideations: Secondary | ICD-10-CM | POA: Diagnosis present

## 2016-06-15 DIAGNOSIS — G4700 Insomnia, unspecified: Secondary | ICD-10-CM | POA: Diagnosis present

## 2016-06-15 DIAGNOSIS — E785 Hyperlipidemia, unspecified: Secondary | ICD-10-CM | POA: Diagnosis present

## 2016-06-15 DIAGNOSIS — F1024 Alcohol dependence with alcohol-induced mood disorder: Secondary | ICD-10-CM | POA: Diagnosis present

## 2016-06-15 DIAGNOSIS — Z23 Encounter for immunization: Secondary | ICD-10-CM | POA: Diagnosis not present

## 2016-06-15 DIAGNOSIS — Y902 Blood alcohol level of 40-59 mg/100 ml: Secondary | ICD-10-CM | POA: Diagnosis present

## 2016-06-15 DIAGNOSIS — Z590 Homelessness: Secondary | ICD-10-CM

## 2016-06-15 DIAGNOSIS — Z79899 Other long term (current) drug therapy: Secondary | ICD-10-CM | POA: Diagnosis not present

## 2016-06-15 DIAGNOSIS — R45850 Homicidal ideations: Secondary | ICD-10-CM | POA: Diagnosis not present

## 2016-06-15 DIAGNOSIS — F1099 Alcohol use, unspecified with unspecified alcohol-induced disorder: Secondary | ICD-10-CM | POA: Diagnosis not present

## 2016-06-15 DIAGNOSIS — F1094 Alcohol use, unspecified with alcohol-induced mood disorder: Secondary | ICD-10-CM

## 2016-06-15 DIAGNOSIS — K219 Gastro-esophageal reflux disease without esophagitis: Secondary | ICD-10-CM | POA: Diagnosis present

## 2016-06-15 HISTORY — DX: Personal history of other diseases of the digestive system: Z87.19

## 2016-06-15 HISTORY — DX: Alcohol abuse, uncomplicated: F10.10

## 2016-06-15 HISTORY — DX: Suicidal ideations: R45.851

## 2016-06-15 LAB — COMPREHENSIVE METABOLIC PANEL
ALT: 40 U/L (ref 17–63)
ANION GAP: 15 (ref 5–15)
AST: 28 U/L (ref 15–41)
Albumin: 4.4 g/dL (ref 3.5–5.0)
Alkaline Phosphatase: 60 U/L (ref 38–126)
BUN: 9 mg/dL (ref 6–20)
CHLORIDE: 103 mmol/L (ref 101–111)
CO2: 20 mmol/L — AB (ref 22–32)
Calcium: 9.5 mg/dL (ref 8.9–10.3)
Creatinine, Ser: 0.84 mg/dL (ref 0.61–1.24)
GFR calc Af Amer: >60 mL/min (ref >60)
GFR calc non Af Amer: >60 mL/min (ref >60)
Glucose, Bld: 93 mg/dL (ref 65–99)
Potassium: 3.6 mmol/L (ref 3.5–5.1)
SODIUM: 138 mmol/L (ref 135–145)
Total Bilirubin: 1 mg/dL (ref 0.3–1.2)
Total Protein: 7.6 g/dL (ref 6.5–8.1)

## 2016-06-15 LAB — CBC
HCT: 47.7 % (ref 39.0–52.0)
HEMOGLOBIN: 17.5 g/dL — AB (ref 13.0–17.0)
MCH: 32.3 pg (ref 26.0–34.0)
MCHC: 36.7 g/dL — AB (ref 30.0–36.0)
MCV: 88 fL (ref 78.0–100.0)
Platelets: 201 10*3/uL (ref 150–400)
RBC: 5.42 MIL/uL (ref 4.22–5.81)
RDW: 13.7 % (ref 11.5–15.5)
WBC: 9.5 10*3/uL (ref 4.0–10.5)

## 2016-06-15 LAB — RAPID URINE DRUG SCREEN, HOSP PERFORMED
Amphetamines: NOT DETECTED
Barbiturates: NOT DETECTED
Benzodiazepines: NOT DETECTED
COCAINE: NOT DETECTED
OPIATES: NOT DETECTED
TETRAHYDROCANNABINOL: NOT DETECTED

## 2016-06-15 LAB — ACETAMINOPHEN LEVEL

## 2016-06-15 LAB — SALICYLATE LEVEL

## 2016-06-15 LAB — ETHANOL: Alcohol, Ethyl (B): 55 mg/dL — ABNORMAL HIGH (ref <5)

## 2016-06-15 MED ORDER — LORAZEPAM 1 MG PO TABS: 1.0000 mg | ORAL_TABLET | Freq: Four times a day (QID) | ORAL | Status: DC

## 2016-06-15 MED ORDER — THIAMINE HCL 100 MG/ML IJ SOLN: 100.0000 mg | Freq: Every day | INTRAMUSCULAR | Status: DC

## 2016-06-15 MED ORDER — LORAZEPAM 1 MG PO TABS: 1.0000 mg | ORAL_TABLET | Freq: Three times a day (TID) | ORAL | Status: DC

## 2016-06-15 MED ORDER — GABAPENTIN 300 MG PO CAPS: 300.0000 mg | ORAL_CAPSULE | Freq: Every day | ORAL | Status: DC

## 2016-06-15 MED ORDER — ADULT MULTIVITAMIN W/MINERALS CH
1.0000 | ORAL_TABLET | Freq: Every day | ORAL | Status: DC
  Administered 2016-06-16 – 2016-06-18 (×3): 1 via ORAL
  Filled 2016-06-15 (×7): qty 1

## 2016-06-15 MED ORDER — LORAZEPAM 1 MG PO TABS: 0.0000 mg | ORAL_TABLET | Freq: Two times a day (BID) | ORAL | Status: DC

## 2016-06-15 MED ORDER — HYDROXYZINE HCL 25 MG PO TABS
25.0000 mg | ORAL_TABLET | Freq: Four times a day (QID) | ORAL | Status: DC | PRN
  Administered 2016-06-15: 25 mg via ORAL
  Filled 2016-06-15: qty 1

## 2016-06-15 MED ORDER — LORAZEPAM 1 MG PO TABS: 1.0000 mg | ORAL_TABLET | Freq: Four times a day (QID) | ORAL | Status: DC | PRN

## 2016-06-15 MED ORDER — VITAMIN B-1 100 MG PO TABS
100.0000 mg | ORAL_TABLET | Freq: Every day | ORAL | Status: DC
  Administered 2016-06-16 – 2016-06-18 (×3): 100 mg via ORAL
  Filled 2016-06-15 (×6): qty 1

## 2016-06-15 MED ORDER — DULOXETINE HCL 20 MG PO CPEP
20.0000 mg | ORAL_CAPSULE | Freq: Every day | ORAL | Status: DC
  Administered 2016-06-15: 20 mg via ORAL
  Filled 2016-06-15: qty 1

## 2016-06-15 MED ORDER — VITAMIN B-1 100 MG PO TABS
100.0000 mg | ORAL_TABLET | Freq: Every day | ORAL | Status: DC
  Administered 2016-06-15: 100 mg via ORAL
  Filled 2016-06-15: qty 1

## 2016-06-15 MED ORDER — LORAZEPAM 1 MG PO TABS: 1.0000 mg | ORAL_TABLET | Freq: Two times a day (BID) | ORAL | Status: DC

## 2016-06-15 MED ORDER — LORAZEPAM 1 MG PO TABS
1.0000 mg | ORAL_TABLET | Freq: Once | ORAL | Status: AC
  Administered 2016-06-15: 1 mg via ORAL
  Filled 2016-06-15: qty 1

## 2016-06-15 MED ORDER — THIAMINE HCL 100 MG/ML IJ SOLN: 100.0000 mg | Freq: Once | INTRAMUSCULAR | Status: DC

## 2016-06-15 MED ORDER — ZOLPIDEM TARTRATE 5 MG PO TABS: 5.0000 mg | ORAL_TABLET | Freq: Every evening | ORAL | Status: DC | PRN

## 2016-06-15 MED ORDER — TRAZODONE HCL 50 MG PO TABS
50.0000 mg | ORAL_TABLET | Freq: Every evening | ORAL | Status: DC | PRN
  Administered 2016-06-15 – 2016-06-16 (×2): 50 mg via ORAL
  Filled 2016-06-15: qty 7
  Filled 2016-06-15 (×2): qty 1

## 2016-06-15 MED ORDER — IBUPROFEN 400 MG PO TABS
600.0000 mg | ORAL_TABLET | Freq: Three times a day (TID) | ORAL | Status: DC | PRN
  Administered 2016-06-15: 600 mg via ORAL
  Filled 2016-06-15: qty 1

## 2016-06-15 MED ORDER — LORAZEPAM 1 MG PO TABS: 1.0000 mg | ORAL_TABLET | Freq: Every day | ORAL | Status: DC

## 2016-06-15 MED ORDER — MAGNESIUM HYDROXIDE 400 MG/5ML PO SUSP: 30.0000 mL | Freq: Every day | ORAL | Status: DC | PRN

## 2016-06-15 MED ORDER — ALUM & MAG HYDROXIDE-SIMETH 200-200-20 MG/5ML PO SUSP: 30.0000 mL | ORAL | Status: DC | PRN

## 2016-06-15 MED ORDER — ACETAMINOPHEN 325 MG PO TABS
650.0000 mg | ORAL_TABLET | ORAL | Status: DC | PRN
  Administered 2016-06-15: 650 mg via ORAL
  Filled 2016-06-15: qty 2

## 2016-06-15 MED ORDER — INFLUENZA VAC SPLIT QUAD 0.5 ML IM SUSY
0.5000 mL | PREFILLED_SYRINGE | INTRAMUSCULAR | Status: AC
  Administered 2016-06-16: 0.5 mL via INTRAMUSCULAR
  Filled 2016-06-15: qty 0.5

## 2016-06-15 MED ORDER — LORAZEPAM 1 MG PO TABS
0.0000 mg | ORAL_TABLET | Freq: Four times a day (QID) | ORAL | Status: DC
  Administered 2016-06-15: 1 mg via ORAL
  Filled 2016-06-15: qty 1

## 2016-06-15 MED ORDER — ONDANSETRON 4 MG PO TBDP: 4.0000 mg | ORAL_TABLET | Freq: Four times a day (QID) | ORAL | Status: DC | PRN

## 2016-06-15 MED ORDER — ONDANSETRON HCL 4 MG PO TABS
4.0000 mg | ORAL_TABLET | Freq: Three times a day (TID) | ORAL | Status: DC | PRN
  Administered 2016-06-15: 4 mg via ORAL
  Filled 2016-06-15 (×2): qty 1

## 2016-06-15 MED ORDER — LOPERAMIDE HCL 2 MG PO CAPS: 2.0000 mg | ORAL_CAPSULE | ORAL | Status: DC | PRN

## 2016-06-15 MED ORDER — NICOTINE POLACRILEX 2 MG MT GUM: 2.0000 mg | CHEWING_GUM | OROMUCOSAL | Status: DC | PRN

## 2016-06-15 MED ORDER — HYDROXYZINE HCL 25 MG PO TABS
25.0000 mg | ORAL_TABLET | Freq: Four times a day (QID) | ORAL | Status: DC | PRN
  Administered 2016-06-15 – 2016-06-16 (×2): 25 mg via ORAL
  Filled 2016-06-15 (×2): qty 1

## 2016-06-15 NOTE — Tx Team (Signed)
Initial Treatment Plan 06/15/2016 4:20 PM Scott ComberJason Maynez ZOX:[id number]    PATIENT STRESSORS: Financial difficulties Marital or family conflict Substance abuse Other: daughter has legal issues   PATIENT STRENGTHS: Wellsite geologistCommunication skills General fund of knowledge Physical Health   PATIENT IDENTIFIED PROBLEMS: Depression  Anxiety  Substance abuse  Suicidal ideation  "Find some longer term treatment solutions or options"  "I would like to just stop drinking period"           DISCHARGE CRITERIA:  Improved stabilization in mood, thinking, and/or behavior Verbal commitment to aftercare and medication compliance Withdrawal symptoms are absent or subacute and managed without 24-hour nursing intervention  PRELIMINARY DISCHARGE PLAN: Outpatient therapy Medication management  PATIENT/FAMILY INVOLVEMENT: This treatment plan has been presented to and reviewed with the patient, Scott Chandler.  The patient and family have been given the opportunity to ask questions and make suggestions.  Levin BaconHeather V Brityn Mastrogiovanni, RN 06/15/2016, 4:20 PM

## 2016-06-15 NOTE — Progress Notes (Signed)
Patient ID: Scott Chandler, male   DOB: 09-Mar-1976, 41 y.o.   MRN: [id number] D: Client in room tonight, reading and in bed. Minimum interaction, nonchalant attitude. A: Writer provided emotional support, encouraged client to report any concerns. "well they haven't given anything since I been here, you got any Ativan" A: Writer provided emotional support, medications reviewed administered as ordered. Staff will monitor q2415min for safety. R: Client is safe on the unit.

## 2016-06-15 NOTE — BHH Counselor (Addendum)
Clinician spoke to Autumn, Charity fundraiserN and discussed the pt [facility], and pt's disposition is AM Psychiatric Evaluation.  Gwinda Passereylese D Bennett, MS, [facility], [facility] Triage Specialist [phone]

## 2016-06-15 NOTE — ED Triage Notes (Signed)
Pt's wife states [facility] and was told to come to ED.  Reports [facility] and [facility] but [facility] himself out.  Requesting detox from ETOH- last drink 9:30pm.  Wife reports anxiety, depression, and suicidal ideation without a plan.  When pt questioned he states, "I have a plan to hang myself."  Also reports generalized abd pain and diarrhea since yesterday.

## 2016-06-15 NOTE — BH Assessment (Signed)
Per the instructions, [facility], Dr. Alyse LowA. Kumar, during morning bridge call, Patient is to be [facility], "started on antidepressants and detox and then referred to [facility] for detox."  Writer asked, "the patient is to be admitted with us [facility]) and referred to [facility] for detox?" Medical director replied, "Yes, patient is to be admitted and started on antidepressants and referred to [facility] or either [facility] for detox." Information about the patient forwarded to [facility] Charge Nurse Edwena Bunde(Janet J.).

## 2016-06-15 NOTE — Progress Notes (Signed)
Pt recommended for inpatient treatment upon Dr. Remus BlakeKumar's review of pt's assessment. Initially treatment team [facility], however [facility], [facility] in order to have pt access most timely treatment.  Assigned bed 303-1. Report # [id number].  Notified [facility].  Ilean SkillMeghan Jeidy Hoerner, MSW, LCSW Clinical Social Work, Disposition  06/15/2016 [phone]

## 2016-06-15 NOTE — ED Notes (Signed)
Belonging logged, on inventory sheet: 2 e cig 1 pr jeans 3 Tshirts  1 dress shirt 1 pr dress pants 1pr blue hospital socks, 1 pr blue socks 1 sweater 1 empty bottle contact solution 1 contact case 1pr boxer briefs, 1 pr boxers Discharge papers with prescriptions Toothbrush, tongue scraper, deodarant Black wallet with various business cards, reward cards Valuables in security env [phone]#[id number] Bag [phone] 3 credit cards SScard, VADL Black iphone in otter box case

## 2016-06-15 NOTE — Progress Notes (Signed)
Scott Chandler is a 41 year old male being admitted voluntarily [facility].  [facility] and alcohol dependence.  He was medically cleared at the ED.  He reported stressors as being homeless, daughter has legal issues, no money, no job and relationship with his wife.  He denies HI or A/V hallucinations.  He does report that he does have mild hallucinations and this is common when he is experiencing withdrawal symptoms.  He reports that he is drinking about 1/5 liquor daily and was recently discharged from OBS unit on 06/12/16.  He left on that date and began drinking again.  He currently denies SI and has agreed to contract for safety on the unit.  He denies any pain or discomfort and appears to be in no physical distress.  Oriented him to the unit.  Admission paperwork completed and signed.  Belongings searched and secured in locker # 32.  Skin assessment completed and no kin issues noted.  Q 15 minute checks initiated for safety.  We will monitor the progress towards his goals.

## 2016-06-15 NOTE — ED Notes (Signed)
Declined W/C at D/C and was escorted to lobby by RN. 

## 2016-06-15 NOTE — ED Provider Notes (Signed)
[facility]-EMERGENCY DEPT Provider Note   CSN: [id number] Arrival date & time: 06/14/16  2346  By signing my name below, I, Elder NegusRussell Johnston, attest that this documentation has been prepared under the direction and in the presence of Dione Boozeavid Torrence Hammack, MD. Electronically Signed: Elder Negusussell Johnston, Scribe. 06/15/16. 1:34 AM.   History   Chief Complaint Chief Complaint  Patient presents with  . Alcohol Problem  . Suicidal    HPI Scott ComberJason Chandler is a 41 y.o. male with history of alcoholism who presents to the ED for medical clearance. This patient states that he typically drinks "as much as he can get". His last alcohol intake was 5 hours ago. [facility] several hours ago seeking detox admission, however they referred him to this facility for medical clearance. He denies any other drug use. At interview, he does endorse suicidal ideations and has considered "hanging himself". However denies any self harm tonight or previous attempts. Denies any illicit drug use. Endorses chronic depression, sleep problems, anhedonia. Endorses "hearing sounds when really drunk". No command hallucinations. Otherwise reporting "a sour stomach". Denies chest pain.   The history is provided by the patient. No language interpreter was used.    Past Medical History:  Diagnosis Date  . Chest pain   . Depression   . ETOH abuse   . GERD (gastroesophageal reflux disease)   . Suicidal ideation     Patient Active Problem List   Diagnosis Date Noted  . Alcohol abuse with alcohol-induced mood disorder (HCC) 11/14/2015  . EPIGASTRIC PAIN 10/22/2009  . FECAL OCCULT BLOOD 10/22/2009  . HYPERLIPIDEMIA 10/21/2009  . ESOPHAGITIS, HX OF 10/21/2009  . GERD 05/31/2008  . CHEST PAIN 05/31/2008  . DIARRHEA 05/31/2008    Past Surgical History:  Procedure Laterality Date  . TRANSTHORACIC ECHOCARDIOGRAM  05/18/2008   normal LVF/ mild LVH/trace mitral and tricuspid regurgitation       Home Medications    Prior  to Admission medications   Medication Sig Start Date End Date Taking? Authorizing Provider  DULoxetine (CYMBALTA) 20 MG capsule Take two 20 mg capsules by mouth once daily 06/12/16   Laveda AbbeLaurie Britton Parks, NP  gabapentin (NEURONTIN) 300 MG capsule Take 1 capsule (300 mg total) by mouth at bedtime. 06/12/16   Laveda AbbeLaurie Britton Parks, NP  hydrOXYzine (ATARAX/VISTARIL) 25 MG tablet Take 1 tablet (25 mg total) by mouth every 6 (six) hours as needed (anxiety/agitation or CIWA < or = 10). 06/12/16   Laveda AbbeLaurie Britton Parks, NP    Family History Family History  Problem Relation Age of Onset  . Hypertension Father   . Cancer Mother     Social History Social History  Substance Use Topics  . Smoking status: Former Games developermoker  . Smokeless tobacco: Never Used     Comment: Pt stated "I vape"  . Alcohol use Yes     Comment: 5th a day     Allergies   Patient has no known allergies.   Review of Systems Review of Systems  Cardiovascular: Negative for chest pain.  Gastrointestinal: Positive for abdominal pain.  Psychiatric/Behavioral: Positive for sleep disturbance and suicidal ideas. Negative for self-injury.       Depression, Anhedonia. Alcoholism.   All other systems reviewed and are negative.    Physical Exam Updated Vital Signs BP (!) 131/106 (BP Location: Left Arm)   Pulse 101   Temp 99.1 F (37.3 C) (Oral)   Resp 17   Ht 5\' 10"  (1.778 m)   Wt 240 lb (  108.9 kg)   SpO2 96%   BMI 34.44 kg/m   Physical Exam  Constitutional: He is oriented to person, place, and time. He appears well-developed and well-nourished.  HENT:  Head: Normocephalic and atraumatic.  Eyes: EOM are normal. Pupils are equal, round, and reactive to light.  Neck: Normal range of motion. Neck supple. No JVD present.  Cardiovascular: Normal rate, regular rhythm and normal heart sounds.   No murmur heard. Pulmonary/Chest: Effort normal and breath sounds normal. He has no wheezes. He has no rales. He exhibits no  tenderness.  Abdominal: Soft. Bowel sounds are normal. He exhibits no distension and no mass.  Mild epigastric tenderness.   Musculoskeletal: Normal range of motion. He exhibits no edema.  Lymphadenopathy:    He has no cervical adenopathy.  Neurological: He is alert and oriented to person, place, and time. No cranial nerve deficit. He exhibits normal muscle tone. Coordination normal.  Skin: Skin is warm and dry. No rash noted.  Psychiatric: He has a normal mood and affect. His behavior is normal. Judgment and thought content normal.  Nursing note and vitals reviewed.    ED Treatments / Results  Labs (all labs ordered are listed, but only abnormal results are displayed) Labs Reviewed  COMPREHENSIVE METABOLIC PANEL - Abnormal; Notable for the following:       Result Value   CO2 20 (*)    All other components within normal limits  ETHANOL - Abnormal; Notable for the following:    Alcohol, Ethyl (B) 55 (*)    All other components within normal limits  ACETAMINOPHEN LEVEL - Abnormal; Notable for the following:    Acetaminophen (Tylenol), Serum <10 (*)    All other components within normal limits  CBC - Abnormal; Notable for the following:    Hemoglobin 17.5 (*)    MCHC 36.7 (*)    All other components within normal limits  RAPID URINE DRUG SCREEN, HOSP PERFORMED  SALICYLATE LEVEL   Procedures Procedures (including critical care time)  Medications Ordered in ED Medications - No data to display   Initial Impression / Assessment and Plan / ED Course  I have reviewed the triage vital signs and the nursing notes.  Pertinent lab results that were available during my care of the patient were reviewed by me and considered in my medical decision making (see chart for details).  Patient with long-standing alcohol abuse and depression. Now having suicidal thoughts. Old records are reviewed, and he has multiple ED visits for alcohol abuse, and does have several foot hospital admission  for alcohol use and depression. Nodes seen from evaluation earlier tonight. He meets inpatient criteria and was sent here for medical clearance. Alcohol level is 55. CO2 is slightly low with normal anion gap. This is not felt to be clinically significant. He is considered to be medically cleared at this point. TTS will be consulted regarding placement.  Final Clinical Impressions(s) / ED Diagnoses   Final diagnoses:  Suicidal ideation  Alcohol use with alcohol-induced mood disorder (HCC)    New Prescriptions New Prescriptions   No medications on file   I personally performed the services described in this documentation, which was scribed in my presence. The recorded information has been reviewed and is accurate.      Dione Booze, MD 06/15/16 [id number]

## 2016-06-16 ENCOUNTER — Encounter (HOSPITAL_COMMUNITY): Payer: Self-pay | Admitting: Psychiatry

## 2016-06-16 DIAGNOSIS — F332 Major depressive disorder, recurrent severe without psychotic features: Principal | ICD-10-CM

## 2016-06-16 DIAGNOSIS — F1099 Alcohol use, unspecified with unspecified alcohol-induced disorder: Secondary | ICD-10-CM

## 2016-06-16 DIAGNOSIS — Z79899 Other long term (current) drug therapy: Secondary | ICD-10-CM

## 2016-06-16 DIAGNOSIS — R45850 Homicidal ideations: Secondary | ICD-10-CM

## 2016-06-16 DIAGNOSIS — R45851 Suicidal ideations: Secondary | ICD-10-CM

## 2016-06-16 MED ORDER — IBUPROFEN 400 MG PO TABS: 400.0000 mg | ORAL_TABLET | Freq: Four times a day (QID) | ORAL | Status: DC | PRN

## 2016-06-16 MED ORDER — GABAPENTIN 300 MG PO CAPS: 300.0000 mg | ORAL_CAPSULE | Freq: Every day | ORAL | Status: DC

## 2016-06-16 MED ORDER — GABAPENTIN 300 MG PO CAPS
300.0000 mg | ORAL_CAPSULE | Freq: Two times a day (BID) | ORAL | Status: DC
  Administered 2016-06-16 – 2016-06-18 (×5): 300 mg via ORAL
  Filled 2016-06-16: qty 14
  Filled 2016-06-16 (×7): qty 1
  Filled 2016-06-16: qty 14
  Filled 2016-06-16: qty 1

## 2016-06-16 MED ORDER — HYDROXYZINE HCL 25 MG PO TABS
25.0000 mg | ORAL_TABLET | Freq: Four times a day (QID) | ORAL | Status: DC | PRN
  Administered 2016-06-16 – 2016-06-17 (×3): 25 mg via ORAL
  Filled 2016-06-16: qty 10
  Filled 2016-06-16 (×2): qty 1
  Filled 2016-06-16: qty 10
  Filled 2016-06-16: qty 1

## 2016-06-16 MED ORDER — DULOXETINE HCL 60 MG PO CPEP
60.0000 mg | ORAL_CAPSULE | Freq: Every day | ORAL | Status: DC
  Administered 2016-06-16 – 2016-06-18 (×3): 60 mg via ORAL
  Filled 2016-06-16 (×3): qty 1
  Filled 2016-06-16: qty 7
  Filled 2016-06-16 (×2): qty 1

## 2016-06-16 NOTE — Progress Notes (Signed)
Pt rated his day a 7 out of 10. Pt goal for tomorrow is to get adjusted to his medication.

## 2016-06-16 NOTE — [facility] Group Notes (Signed)
Adult Psychoeducational Group Note  Date:  06/16/2016 Time:  4:26 AM  Group Topic/Focus:  Wrap-Up Group:   The focus of this group is to help patients review their daily goal of treatment and discuss progress on daily workbooks.  Participation Level:  Did Not Attend  Participation Quality:  DNA  Affect:  DNA  Cognitive:  DNA  Insight: None  Engagement in Group:  DNA  Modes of Intervention:  DNA  Additional Comments:  DNA  Jamas LavBailey, Taji Barretto W 06/16/2016, 4:26 AM

## 2016-06-16 NOTE — Tx Team (Signed)
Interdisciplinary Treatment and Diagnostic Plan Update  06/16/2016 Time of Session: 0930 Scott Chandler MRN: [id number]  Principal Diagnosis: MDD recurrent, severe with psychotic features  Secondary Diagnoses: Active Problems:   Alcohol use disorder (HCC)   Current Medications:  Current Facility-Administered Medications  Medication Dose Route Frequency Provider Last Rate Last Dose  . DULoxetine (CYMBALTA) DR capsule 60 mg  60 mg Oral Daily Antonieta Pert, MD      . gabapentin (NEURONTIN) capsule 300 mg  300 mg Oral BID Antonieta Pert, MD      . hydrOXYzine (ATARAX/VISTARIL) tablet 25 mg  25 mg Oral Q6H PRN Beau Fanny, FNP   25 mg at 06/15/16 2203  . Influenza vac split quadrivalent PF (FLUARIX) injection 0.5 mL  0.5 mL Intramuscular Tomorrow-1000 Fernando A Cobos, MD      . loperamide (IMODIUM) capsule 2-4 mg  2-4 mg Oral PRN Beau Fanny, FNP      . LORazepam (ATIVAN) tablet 1 mg  1 mg Oral Q6H PRN Beau Fanny, FNP      . magnesium hydroxide (MILK OF MAGNESIA) suspension 30 mL  30 mL Oral Daily PRN Beau Fanny, FNP      . multivitamin with minerals tablet 1 tablet  1 tablet Oral Daily Beau Fanny, FNP   1 tablet at 06/16/16 0810  . nicotine polacrilex (NICORETTE) gum 2 mg  2 mg Oral PRN Craige Cotta, MD      . ondansetron (ZOFRAN-ODT) disintegrating tablet 4 mg  4 mg Oral Q6H PRN Beau Fanny, FNP      . thiamine (B-1) injection 100 mg  100 mg Intramuscular Once Beau Fanny, FNP      . thiamine (VITAMIN B-1) tablet 100 mg  100 mg Oral Daily Beau Fanny, FNP   100 mg at 06/16/16 0810  . traZODone (DESYREL) tablet 50 mg  50 mg Oral QHS PRN Beau Fanny, FNP   50 mg at 06/15/16 2202   PTA Medications: Prescriptions Prior to Admission  Medication Sig Dispense Refill Last Dose  . DULoxetine (CYMBALTA) 20 MG capsule Take two 20 mg capsules by mouth once daily 60 capsule 3 Past Week at Unknown time  . gabapentin (NEURONTIN) 300 MG capsule Take 1 capsule (300  mg total) by mouth at bedtime. 30 capsule 0 Past Month at Unknown time  . hydrOXYzine (ATARAX/VISTARIL) 25 MG tablet Take 1 tablet (25 mg total) by mouth every 6 (six) hours as needed (anxiety/agitation or CIWA < or = 10). 60 tablet 0 unk  . ibuprofen (ADVIL,MOTRIN) 200 MG tablet Take 400 mg by mouth every 6 (six) hours as needed for moderate pain.   unk    Patient Stressors: Financial difficulties Marital or family conflict Substance abuse Other: daughter has legal issues  Patient Strengths: Wellsite geologist fund of knowledge Physical Health  Treatment Modalities: Medication Management, Group therapy, Case management,  1 to 1 session with clinician, Psychoeducation, Recreational therapy.   Physician Treatment Plan for Primary Diagnosis: MDD recurrent, severe with psychotic features  Long Term Goal(s): Improvement in symptoms so as ready for discharge Improvement in symptoms so as ready for discharge   Short Term Goals: Ability to identify changes in lifestyle to reduce recurrence of condition will improve Ability to verbalize feelings will improve Ability to disclose and discuss suicidal ideas Ability to demonstrate self-control will improve Ability to identify and develop effective coping behaviors will improve Ability to maintain clinical measurements within normal  limits will improve Compliance with prescribed medications will improve Ability to identify triggers associated with substance abuse/mental health issues will improve Ability to identify changes in lifestyle to reduce recurrence of condition will improve Ability to verbalize feelings will improve Ability to disclose and discuss suicidal ideas Ability to demonstrate self-control will improve Ability to identify and develop effective coping behaviors will improve Ability to maintain clinical measurements within normal limits will improve Compliance with prescribed medications will improve Ability to  identify triggers associated with substance abuse/mental health issues will improve  Medication Management: Evaluate patient's response, side effects, and tolerance of medication regimen.  Therapeutic Interventions: 1 to 1 sessions, Unit Group sessions and Medication administration.  Evaluation of Outcomes: Progressing  Physician Treatment Plan for Secondary Diagnosis: Active Problems:   Alcohol use disorder (HCC)  Long Term Goal(s): Improvement in symptoms so as ready for discharge Improvement in symptoms so as ready for discharge   Short Term Goals: Ability to identify changes in lifestyle to reduce recurrence of condition will improve Ability to verbalize feelings will improve Ability to disclose and discuss suicidal ideas Ability to demonstrate self-control will improve Ability to identify and develop effective coping behaviors will improve Ability to maintain clinical measurements within normal limits will improve Compliance with prescribed medications will improve Ability to identify triggers associated with substance abuse/mental health issues will improve Ability to identify changes in lifestyle to reduce recurrence of condition will improve Ability to verbalize feelings will improve Ability to disclose and discuss suicidal ideas Ability to demonstrate self-control will improve Ability to identify and develop effective coping behaviors will improve Ability to maintain clinical measurements within normal limits will improve Compliance with prescribed medications will improve Ability to identify triggers associated with substance abuse/mental health issues will improve     Medication Management: Evaluate patient's response, side effects, and tolerance of medication regimen.  Therapeutic Interventions: 1 to 1 sessions, Unit Group sessions and Medication administration.  Evaluation of Outcomes: Progressing   RN Treatment Plan for Primary Diagnosis: MDD recurrent, severe with  psychotic features  Long Term Goal(s): Knowledge of disease and therapeutic regimen to maintain health will improve  Short Term Goals: Ability to remain free from injury will improve, Ability to disclose and discuss suicidal ideas and Ability to identify and develop effective coping behaviors will improve  Medication Management: RN will administer medications as ordered by provider, will assess and evaluate patient's response and provide education to patient for prescribed medication. RN will report any adverse and/or side effects to prescribing provider.  Therapeutic Interventions: 1 on 1 counseling sessions, Psychoeducation, Medication administration, Evaluate responses to treatment, Monitor vital signs and CBGs as ordered, Perform/monitor CIWA, COWS, AIMS and Fall Risk screenings as ordered, Perform wound care treatments as ordered.  Evaluation of Outcomes: Progressing   LCSW Treatment Plan for Primary Diagnosis: MDD recurrent, severe with psychotic features   Long Term Goal(s): Safe transition to appropriate next level of care at discharge, Engage patient in therapeutic group addressing interpersonal concerns.  Short Term Goals: Engage patient in aftercare planning with referrals and resources, Facilitate patient progression through stages of change regarding substance use diagnoses and concerns and Identify triggers associated with mental health/substance abuse issues  Therapeutic Interventions: Assess for all discharge needs, 1 to 1 time with Social worker, Explore available resources and support systems, Assess for adequacy in community support network, Educate family and significant other(s) on suicide prevention, Complete Psychosocial Assessment, Interpersonal group therapy.  Evaluation of Outcomes: Progressing   Progress in Treatment:  Attending groups: No. New to unit. Continuing to assess.  Participating in groups: No. Taking medication as prescribed: Yes. Toleration medication:  Yes. Family/Significant other contact made: No, will contact:  family member if patient consents Patient understands diagnosis: Yes. Discussing patient identified problems/goals with staff: Yes. Medical problems stabilized or resolved: Yes. Denies suicidal/homicidal ideation: Yes. Issues/concerns per patient self-inventory: No. Other: n/a   New problem(s) identified: No, Describe:  n/a  New Short Term/Long Term Goal(s): detox; medication stabilization; development of comprehensive mental wellness/sobriety plan.   Discharge Plan or Barriers: CSW assessing for appropriate referrals. Pt was [facility] to [facility] during his last admission to [facility] 8/17.   Reason for Continuation of Hospitalization: Depression Medication stabilization Withdrawal symptoms  Estimated Length of Stay: 3-5 days   Attendees: Patient: 06/16/2016 11:18 AM  Physician: Dr. Jola Babinski MD 06/16/2016 11:18 AM  Nursing: Jobe Igo RN 06/16/2016 11:18 AM  RN Care Manager: Onnie Boer CM 06/16/2016 11:18 AM  Social Worker: Chartered loss adjuster, LCSW; Donnelly Stager LCSWA  06/16/2016 11:18 AM  Recreational Therapist: Juliann Pares 06/16/2016 11:18 AM  Other: Armandina Stammer NP; May Augustin NP 06/16/2016 11:18 AM  Other:  06/16/2016 11:18 AM  Other: 06/16/2016 11:18 AM    Scribe for Treatment Team: Ledell Peoples Smart, LCSW 06/16/2016 11:18 AM

## 2016-06-16 NOTE — H&P (Signed)
Psychiatric Admission Assessment Adult  Patient Identification: Roran Wegner MRN:  [id number] Date of Evaluation:  06/16/2016 Chief Complaint:  MDD RECURRENT SEVERRE WITH PSYCHOTIC FEATURES ALCOHOL USE DISORDER,SEVERE Principal Diagnosis: suicidal and alcohol withdrawal Diagnosis:  Depression and alcohol use disorder Patient Active Problem List   Diagnosis Date Noted  . Alcohol use disorder ([place]) [F10.99] 06/15/2016  . 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 EPIGASTRIC PAIN [R10.13] 10/22/2009  . FECAL OCCULT BLOOD [R19.5] 10/22/2009  . HYPERLIPIDEMIA [E78.5] 10/21/2009  . ESOPHAGITIS, HX OF [Z87.19] 10/21/2009  . GERD [K21.9] 05/31/2008  . CHEST PAIN [R07.9] 05/31/2008  . DIARRHEA [R19.7] 05/31/2008   History of Present Illness: Patient is seen and examined. Patient is a 41 YO male with a PPHx significant for depression and alcohol use disorder. The patient "broke in" to his ex-wife's "alcohol stash" and drank all of it. He was brought to the ED by his ex-wife and he drank a six pack en route. He complained of suicidal ideation and decision was made to admit. The patient was recently held in the observation unit for similar circumstances approximately 2-3 days ago. He started drinking again after discharge. The patient stated that he had recently lost his job in [place] where he had been living with a GF. He has had an unstable relationship with the GF and returned to the [place] area. He stated that the longest period of his sobriety was approximately 3 months while he had been in [place]. He had previously been able to maintain sobriety while taking zoloft and ativan. He stated that when his depression is under better control he is able to refrain from drinking. He admitted to helplessness, hopelessness, and sadness. He admitted to [facility] without plan. "I never had the guts to do it". Stressors include unemployment, homelessness, a daughter in a group home.   Associated Signs/Symptoms: Depression Symptoms:  depressed mood, anhedonia, insomnia, psychomotor agitation, fatigue, feelings of worthlessness/guilt, hopelessness, suicidal thoughts without plan, anxiety, loss of energy/fatigue, (Hypo) Manic Symptoms:  Impulsivity, Anxiety Symptoms:  Excessive Worry, Psychotic Symptoms:  none PTSD Symptoms: NA Total Time spent with patient: 45 minutes  Past Psychiatric History: previous diagnosis of alcohol use disorder and depression  Is the patient at risk to self? Yes.    Has the patient been a risk to self in the past 6 months? Yes.    Has the patient been a risk to self within the distant past? Yes.    Is the patient a risk to others? No.  Has the patient been a risk to others in the past 6 months? No.  Has the patient been a risk to others within the distant past? No.   Prior Inpatient Therapy:  One previous in patient at [facility] in 2017. Was recently in Observation Unit at [facility] 2-3 days ago Prior Outpatient Therapy:  previously taken zoloft, celexa, cymbalta. Had been arranged for [facility] on last hospitalization but never attended.  Alcohol Screening: 1. How often do you have a drink containing alcohol?: 4 or more times a week 2. How many drinks containing alcohol do you have on a typical day when you are drinking?: 10 or more 3. How often do you have six or more drinks on one occasion?: Daily or almost daily Preliminary Score: 8 4. How often during the last year have you found that you were not able to stop drinking once you had started?: Weekly 5. How often during the last year have you failed to do what  was normally expected from you becasue of drinking?: Weekly 6. How often during the last year have you needed a first drink in the morning to get yourself going after a heavy drinking session?: Weekly 7. How often during the last year have you had a feeling of guilt of remorse after drinking?: Daily or almost daily 8. How often during the  last year have you been unable to remember what happened the night before because you had been drinking?: Weekly 9. Have you or someone else been injured as a result of your drinking?: No 10. Has a relative or friend or a doctor or another health worker been concerned about your drinking or suggested you cut down?: Yes, during the last year Alcohol Use Disorder Identification Test Final Score (AUDIT): 32 Brief Intervention: Yes Substance Abuse History in the last 12 months:  Yes.   Consequences of Substance Abuse: Family Consequences:  homeless, divorced Withdrawal Symptoms:   Cramps Diaphoresis Headaches Tremors Previous Psychotropic Medications: Yes  Psychological Evaluations: Yes  Past Medical History:  Past Medical History:  Diagnosis Date  . Chest pain   . Depression   . ETOH abuse   . GERD (gastroesophageal reflux disease)   . History of hiatal hernia   . Suicidal ideation     Past Surgical History:  Procedure Laterality Date  . TRANSTHORACIC ECHOCARDIOGRAM  05/18/2008   normal LVF/ mild LVH/trace mitral and tricuspid regurgitation   Family History:  Family History  Problem Relation Age of Onset  . Hypertension Father   . Cancer Mother    Family Psychiatric  History: Grandfather ETOH, mother "nervous breakdown"., father ETOH Tobacco Screening: Have you used any form of tobacco in the last 30 days? (Cigarettes, Smokeless Tobacco, Cigars, and/or Pipes): Yes Tobacco use, Select all that apply: smokeless tobacco use daily (vaping) Are you interested in Tobacco Cessation Medications?: Yes, will notify MD for an order Counseled patient on smoking cessation including recognizing danger situations, developing coping skills and basic information about quitting provided: Refused/Declined practical counseling Social History:  History  Alcohol Use  . Yes    Comment: 5th a day     History  Drug Use No    Additional Social History:      Pain Medications: See  MAR Prescriptions: See MAR Over the Counter: See MAR History of alcohol / drug use?: Yes Longest period of sobriety (when/how long): One week this year. Negative Consequences of Use: Personal relationships Withdrawal Symptoms: DTs Name of Substance 1: Alcohol  1 - Age of First Use: UTA 1 - Amount (size/oz): Pt reportedn dirnking liquor since he was discharged from [place] (obs unit) on Friday. Pt reported, drinking beers to come to [facility].  1 - Frequency: Pt reported, daily.  1 - Duration: UTA  1 - Last Use / Amount: 06/14/16                  Allergies:  No Known Allergies Lab Results:  Results for orders placed or performed during the hospital encounter of 06/15/16 (from the past 48 hour(s))  Comprehensive metabolic panel     Status: Abnormal   Collection Time: 06/15/16 12:13 AM  Result Value Ref Range   Sodium 138 135 - 145 mmol/L   Potassium 3.6 3.5 - 5.1 mmol/L   Chloride 103 101 - 111 mmol/L   CO2 20 (L) 22 - 32 mmol/L   Glucose, Bld 93 65 - 99 mg/dL   BUN 9 6 - 20 mg/dL  Creatinine, Ser 0.84 0.61 - 1.24 mg/dL   Calcium 9.5 8.9 - 10.3 mg/dL   Total Protein 7.6 6.5 - 8.1 g/dL   Albumin 4.4 3.5 - 5.0 g/dL   AST 28 15 - 41 U/L   ALT 40 17 - 63 U/L   Alkaline Phosphatase 60 38 - 126 U/L   Total Bilirubin 1.0 0.3 - 1.2 mg/dL   GFR calc non Af Amer >60 >60 mL/min   GFR calc Af Amer >60 >60 mL/min    Comment: (NOTE) The eGFR has been calculated using the CKD EPI equation. This calculation has not been validated in all clinical situations. eGFR's persistently <60 mL/min signify possible Chronic Kidney Disease.    Anion gap 15 5 - 15  Ethanol     Status: Abnormal   Collection Time: 06/15/16 12:13 AM  Result Value Ref Range   Alcohol, Ethyl (B) 55 (H) <5 mg/dL    Comment:        LOWEST DETECTABLE LIMIT FOR SERUM ALCOHOL IS 5 mg/dL FOR MEDICAL PURPOSES ONLY   Salicylate level     Status: None   Collection Time: 06/15/16 12:13 AM  Result Value Ref Range    Salicylate Lvl <9.4 2.8 - 30.0 mg/dL  Acetaminophen level     Status: Abnormal   Collection Time: 06/15/16 12:13 AM  Result Value Ref Range   Acetaminophen (Tylenol), Serum <10 (L) 10 - 30 ug/mL    Comment:        THERAPEUTIC CONCENTRATIONS VARY SIGNIFICANTLY. A RANGE OF 10-30 ug/mL MAY BE AN EFFECTIVE CONCENTRATION FOR MANY PATIENTS. HOWEVER, SOME ARE BEST TREATED AT CONCENTRATIONS OUTSIDE THIS RANGE. ACETAMINOPHEN CONCENTRATIONS >150 ug/mL AT 4 HOURS AFTER INGESTION AND >50 ug/mL AT 12 HOURS AFTER INGESTION ARE OFTEN ASSOCIATED WITH TOXIC REACTIONS.   cbc     Status: Abnormal   Collection Time: 06/15/16 12:13 AM  Result Value Ref Range   WBC 9.5 4.0 - 10.5 K/uL   RBC 5.42 4.22 - 5.81 MIL/uL   Hemoglobin 17.5 (H) 13.0 - 17.0 g/dL   HCT 47.7 39.0 - 52.0 %   MCV 88.0 78.0 - 100.0 fL   MCH 32.3 26.0 - 34.0 pg   MCHC 36.7 (H) 30.0 - 36.0 g/dL   RDW 13.7 11.5 - 15.5 %   Platelets 201 150 - 400 K/uL  Rapid urine drug screen (hospital performed)     Status: None   Collection Time: 06/15/16 12:13 AM  Result Value Ref Range   Opiates NONE DETECTED NONE DETECTED   Cocaine NONE DETECTED NONE DETECTED   Benzodiazepines NONE DETECTED NONE DETECTED   Amphetamines NONE DETECTED NONE DETECTED   Tetrahydrocannabinol NONE DETECTED NONE DETECTED   Barbiturates NONE DETECTED NONE DETECTED    Comment:        DRUG SCREEN FOR MEDICAL PURPOSES ONLY.  IF CONFIRMATION IS NEEDED FOR ANY PURPOSE, NOTIFY LAB WITHIN 5 DAYS.        LOWEST DETECTABLE LIMITS FOR URINE DRUG SCREEN Drug Class       Cutoff (ng/mL) Amphetamine      1000 Barbiturate      200 Benzodiazepine   174 Tricyclics       081 Opiates          300 Cocaine          300 THC              50     Blood Alcohol level:  Lab Results  Component Value Date  ETH 55 (H) 06/15/2016   ETH 171 (H) 70/96/2836    Metabolic Disorder Labs:  No results found for: HGBA1C, MPG No results found for: PROLACTIN No results found for:  CHOL, TRIG, HDL, CHOLHDL, VLDL, LDLCALC  Current Medications: Current Facility-Administered Medications  Medication Dose Route Frequency Provider Last Rate Last Dose  . hydrOXYzine (ATARAX/VISTARIL) tablet 25 mg  25 mg Oral Q6H PRN Benjamine Mola, FNP   25 mg at 06/15/16 2203  . Influenza vac split quadrivalent PF (FLUARIX) injection 0.5 mL  0.5 mL Intramuscular Tomorrow-1000 Fernando A Cobos, MD      . loperamide (IMODIUM) capsule 2-4 mg  2-4 mg Oral PRN Benjamine Mola, FNP      . LORazepam (ATIVAN) tablet 1 mg  1 mg Oral Q6H PRN Benjamine Mola, FNP      . magnesium hydroxide (MILK OF MAGNESIA) suspension 30 mL  30 mL Oral Daily PRN Benjamine Mola, FNP      . multivitamin with minerals tablet 1 tablet  1 tablet Oral Daily Benjamine Mola, FNP   1 tablet at 06/16/16 0810  . nicotine polacrilex (NICORETTE) gum 2 mg  2 mg Oral PRN Jenne Campus, MD      . ondansetron (ZOFRAN-ODT) disintegrating tablet 4 mg  4 mg Oral Q6H PRN Benjamine Mola, FNP      . thiamine (B-1) injection 100 mg  100 mg Intramuscular Once Benjamine Mola, FNP      . thiamine (VITAMIN B-1) tablet 100 mg  100 mg Oral Daily Benjamine Mola, FNP   100 mg at 06/16/16 0810  . traZODone (DESYREL) tablet 50 mg  50 mg Oral QHS PRN Benjamine Mola, FNP   50 mg at 06/15/16 2202   PTA Medications: Prescriptions Prior to Admission  Medication Sig Dispense Refill Last Dose  . DULoxetine (CYMBALTA) 20 MG capsule Take two 20 mg capsules by mouth once daily 60 capsule 3 Past Week at Unknown time  . gabapentin (NEURONTIN) 300 MG capsule Take 1 capsule (300 mg total) by mouth at bedtime. 30 capsule 0 Past Month at Unknown time  . hydrOXYzine (ATARAX/VISTARIL) 25 MG tablet Take 1 tablet (25 mg total) by mouth every 6 (six) hours as needed (anxiety/agitation or CIWA < or = 10). 60 tablet 0 unk  . ibuprofen (ADVIL,MOTRIN) 200 MG tablet Take 400 mg by mouth every 6 (six) hours as needed for moderate pain.   unk    Musculoskeletal: Strength &  Muscle Tone: within normal limits Gait & Station: normal Patient leans: N/A  Psychiatric Specialty Exam: Physical Exam  ROS  Blood pressure 128/75, pulse 83, temperature 97.7 F (36.5 C), temperature source Oral, resp. rate 18, height 5' 10.5" (1.791 m), weight 105.7 kg (233 lb).Body mass index is 32.96 kg/m.  General Appearance: Casual  Eye Contact:  Fair  Speech:  Clear and Coherent  Volume:  Normal  Mood:  Depressed  Affect:  Congruent  Thought Process:  Coherent  Orientation:  Full (Time, Place, and Person)  Thought Content:  Negative  Suicidal Thoughts:  Yes.  without intent/plan  Homicidal Thoughts:  Yes.  without intent/plan  Memory:  Immediate;   Fair  Judgement:  Impaired  Insight:  Lacking  Psychomotor Activity:  Restlessness  Concentration:  Concentration: Fair  Recall:  [employer] of Knowledge:  Fair  Language:  Fair  Akathisia:  No  Handed:  Right  AIMS (if indicated):     Assets:  Communication Skills Desire for Improvement Physical Health Resilience Vocational/Educational  ADL's:  Intact  Cognition:  WNL  Sleep:       Treatment Plan Summary: Daily contact with patient to assess and evaluate symptoms and progress in treatment, Medication management and Plan 1) increase cymbalta to 60 mg a day, 2) restart gabapentin at 300 mg a day, 3) ETOH withdrawal protocol, 4) therapeutic [place], 5) arrange for out patient treatment after discharge  Observation Level/Precautions:  Detox 15 minute checks  Laboratory:  CBC Chemistry Profile Folic Acid GGT UDS UA Vitamin B-12  Psychotherapy:    Medications:    Consultations:    Discharge Concerns:    Estimated LOS:  Other:     Physician Treatment Plan for Primary Diagnosis: <principal problem not specified> Long Term Goal(s): Improvement in symptoms so as ready for discharge  Short Term Goals: Ability to identify changes in lifestyle to reduce recurrence of condition will improve, Ability to verbalize  feelings will improve, Ability to disclose and discuss suicidal ideas, Ability to demonstrate self-control will improve, Ability to identify and develop effective coping behaviors will improve, Ability to maintain clinical measurements within normal limits will improve, Compliance with prescribed medications will improve and Ability to identify triggers associated with substance abuse/mental health issues will improve  Physician Treatment Plan for Secondary Diagnosis: Active Problems:   Alcohol use disorder ([place])  Long Term Goal(s): Improvement in symptoms so as ready for discharge  Short Term Goals: Ability to identify changes in lifestyle to reduce recurrence of condition will improve, Ability to verbalize feelings will improve, Ability to disclose and discuss suicidal ideas, Ability to demonstrate self-control will improve, Ability to identify and develop effective coping behaviors will improve, Ability to maintain clinical measurements within normal limits will improve, Compliance with prescribed medications will improve and Ability to identify triggers associated with substance abuse/mental health issues will improve  I certify that inpatient services furnished can reasonably be expected to improve the patient's condition.    Sharma Covert, MD 2/20/20188:47 AM

## 2016-06-16 NOTE — Plan of Care (Signed)
Problem: Safety: Goal: Periods of time without injury will increase Outcome: Progressing Pt is experiencing SI but contracts for safety. He remains safe with 15-minute checks in place.

## 2016-06-16 NOTE — Progress Notes (Signed)
Recreation Therapy Notes  Animal-Assisted Activity (AAA) Program Checklist/Progress Notes Patient Eligibility Criteria Checklist & Daily Group note for Rec TxIntervention  Date: 02.20.2018 Time: 2:45pm Location: [address] Dayroom    AAA/T Program Assumption of Risk Form signed by Patient/ or Parent Legal Guardian Yes  Patient is free of allergies or sever asthma Yes  Patient reports no fear of animals Yes  Patient reports no history of cruelty to animals Yes  Patient understands his/her participation is voluntary Yes  Patient washes hands before animal contact Yes  Patient washes hands after animal contact Yes  Behavioral Response: Did not attend.    Kati Riggenbach L Chrisma Hurlock, LRT/CTRS         Pinchos Topel L 06/16/2016 2:57 PM 

## 2016-06-16 NOTE — Plan of Care (Signed)
Problem: Safety: Goal: Periods of time without injury will increase Outcome: Progressing Periods of time without injury will increase AEB q4915min safety checks and medications review.

## 2016-06-16 NOTE — Progress Notes (Signed)
Patient ID: Scott ComberJason Chandler, male   DOB: 16-Mar-1976, 41 y.o.   MRN: [id number] D: Client visible on the unit tonight, affect brighter. Client reports of his day "a little irritated" noting wife visited tonight. A: Writer provided emotional support and commented on the fact that client was up and out of room tonight. Client responds "well got to start somewhere" Client says he hopes to go to a long term rehab facility. Medications reviewed, administered as ordered. R: Client is safe on the unit.

## 2016-06-16 NOTE — [facility] Group Notes (Signed)
[facility] LCSW Group Therapy  06/16/2016 2:49 PM  Type of Therapy:  Group Therapy  Participation Level:  Did Not Attend-pt did not attend. Chose to rest in room.   Summary of Progress/Problems: MHA Speaker came to talk about his personal journey with substance abuse and addiction. The pt processed ways by which to relate to the speaker. MHA speaker provided handouts and educational information pertaining to groups and services offered by the [facility].   Madilyne Tadlock N Smart LCSW 06/16/2016, 2:49 PM

## 2016-06-16 NOTE — Progress Notes (Signed)
D: Scott Chandler admitted some passive SI but contracted for safety this morning. He's been calm and cooperative, though a little on edge at times. He's been concerned about the price of medication post-discharge, as he's uninsured. On his self inventory, he rated his depression 7/10, hopelessness 7/10, and anxiety 8/10. He reported fair sleep, fair appetite, low energy level, and poor concentration. He said he was having tremors this morning, but by noon he said he wasn't feeling any S&S of withdrawal. He agreed to notify staff if he began feeling worse.   A: Meds given as ordered. No PRNs requested or required. Q15 safety checks maintained. Support/encouragement offered. Looked up cost of med at his pharmacy of choice [facility] to allay concerns.   R: Pt remains free from harm and continues with treatment. Will continue to monitor for needs/safety.
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
## 2016-06-17 ENCOUNTER — Encounter (HOSPITAL_COMMUNITY): Payer: Self-pay | Admitting: Psychiatry

## 2016-06-17 NOTE — Progress Notes (Signed)
D:  Patient awake and alert; oriented x 4; he denies suicidal and homicidal ideation and AVH; no self-injurious behaviors noted or reported. A:  Medications given as scheduled;  Emotional support provided; encouraged him to seek assistance with needs/concerns. R:  Safety maintained on unit. 

## 2016-06-17 NOTE — Progress Notes (Signed)
[facility] Progress Note  06/17/2016 12:14 PM Scott ComberJason Bowersox  MRN:  [id number] Subjective:  Patient is a 41 YO male with suicidal ideation, depression and alcohol use disorder Principal Problem: alcohol use disorder, severe Diagnosis:   Patient Active Problem List   Diagnosis Date Noted  . Alcohol use disorder (HCC) [F10.99] 06/15/2016  . Alcohol abuse with alcohol-induced mood disorder (HCC) [F10.14] 11/14/2015  . EPIGASTRIC PAIN [R10.13] 10/22/2009  . FECAL OCCULT BLOOD [R19.5] 10/22/2009  . HYPERLIPIDEMIA [E78.5] 10/21/2009  . ESOPHAGITIS, HX OF [Z87.19] 10/21/2009  . GERD [K21.9] 05/31/2008  . CHEST PAIN [R07.9] 05/31/2008  . DIARRHEA [R19.7] 05/31/2008   Total Time spent with patient: 20 minutes  Past Psychiatric History: see H&P  Past Medical History:  Past Medical History:  Diagnosis Date  . Chest pain   . Depression   . ETOH abuse   . GERD (gastroesophageal reflux disease)   . History of hiatal hernia   . Suicidal ideation     Past Surgical History:  Procedure Laterality Date  . TRANSTHORACIC ECHOCARDIOGRAM  05/18/2008   normal LVF/ mild LVH/trace mitral and tricuspid regurgitation   Family History:  Family History  Problem Relation Age of Onset  . Hypertension Father   . Cancer Mother    Family Psychiatric  History: see H&P Social History:  History  Alcohol Use  . Yes    Comment: 5th a day     History  Drug Use No    Social History   Social History  . Marital status: Legally Separated    Spouse name: N/A  . Number of children: N/A  . Years of education: N/A   Occupational History  . Unemployed    Social History Main Topics  . Smoking status: Former Games developermoker  . Smokeless tobacco: Never Used     Comment: Pt stated "I vape"  . Alcohol use Yes     Comment: 5th a day  . Drug use: No  . Sexual activity: Not Currently    Partners: Male   Other Topics Concern  . None   Social History Narrative   Pt lost his job and is separated from his wife.    Additional Social History:    Pain Medications: See MAR Prescriptions: See MAR Over the Counter: See MAR History of alcohol / drug use?: Yes Longest period of sobriety (when/how long): One week this year. Negative Consequences of Use: Personal relationships Withdrawal Symptoms: DTs Name of Substance 1: Alcohol  1 - Age of First Use: UTA 1 - Amount (size/oz): Pt reportedn dirnking liquor since [facility]. Pt reported, drinking beers [facility]  1 - Frequency: Pt reported, daily.  1 - Duration: UTA  1 - Last Use / Amount: 06/14/16                  Sleep: Good  Appetite:  Good  Current Medications: Current Facility-Administered Medications  Medication Dose Route Frequency Provider Last Rate Last Dose  . DULoxetine (CYMBALTA) DR capsule 60 mg  60 mg Oral Daily Antonieta PertGreg Lawson Lukah Goswami, MD   60 mg at 06/17/16 0758  . gabapentin (NEURONTIN) capsule 300 mg  300 mg Oral BID Antonieta PertGreg Lawson Kilan Banfill, MD   300 mg at 06/17/16 0759  . hydrOXYzine (ATARAX/VISTARIL) tablet 25 mg  25 mg Oral Q6H PRN Antonieta PertGreg Lawson Ariona Deschene, MD   25 mg at 06/17/16 1142  . ibuprofen (ADVIL,MOTRIN) tablet 400 mg  400 mg  Oral Q6H PRN Antonieta Pert, MD      . loperamide (IMODIUM) capsule 2-4 mg  2-4 mg Oral PRN Beau Fanny, FNP      . LORazepam (ATIVAN) tablet 1 mg  1 mg Oral Q6H PRN Beau Fanny, FNP      . magnesium hydroxide (MILK OF MAGNESIA) suspension 30 mL  30 mL Oral Daily PRN Beau Fanny, FNP      . multivitamin with minerals tablet 1 tablet  1 tablet Oral Daily Beau Fanny, FNP   1 tablet at 06/17/16 0759  . nicotine polacrilex (NICORETTE) gum 2 mg  2 mg Oral PRN Craige Cotta, MD      . ondansetron (ZOFRAN-ODT) disintegrating tablet 4 mg  4 mg Oral Q6H PRN Beau Fanny, FNP      . thiamine (B-1) injection 100 mg  100 mg Intramuscular Once Beau Fanny, FNP      . thiamine (VITAMIN B-1) tablet 100 mg  100 mg Oral Daily Beau Fanny, FNP   100  mg at 06/17/16 0758  . traZODone (DESYREL) tablet 50 mg  50 mg Oral QHS PRN Beau Fanny, FNP   50 mg at 06/16/16 2131    Lab Results: No results found for this or any previous visit (from the past 48 hour(s)).  Blood Alcohol level:  Lab Results  Component Value Date   ETH 55 (H) 06/15/2016   ETH 171 (H) 06/10/2016    Metabolic Disorder Labs: No results found for: HGBA1C, MPG No results found for: PROLACTIN No results found for: CHOL, TRIG, HDL, CHOLHDL, VLDL, LDLCALC  Physical Findings: AIMS: Facial and Oral Movements Muscles of Facial Expression: None, normal Lips and Perioral Area: None, normal Jaw: None, normal Tongue: None, normal,Extremity Movements Upper (arms, wrists, hands, fingers): None, normal Lower (legs, knees, ankles, toes): None, normal, Trunk Movements Neck, shoulders, hips: None, normal, Overall Severity Severity of abnormal movements (highest score from questions above): None, normal Incapacitation due to abnormal movements: None, normal Patient's awareness of abnormal movements (rate only patient's report): No Awareness, Dental Status Current problems with teeth and/or dentures?: No Does patient usually wear dentures?: No  CIWA:  CIWA-Ar Total: 1 COWS:     Musculoskeletal: Strength & Muscle Tone: within normal limits Gait & Station: normal Patient leans: N/A  Psychiatric Specialty Exam: Physical Exam  ROS  Blood pressure 115/75, pulse 88, temperature 97.8 F (36.6 C), temperature source Oral, resp. rate 18, height 5' 10.5" (1.791 m), weight 105.7 kg (233 lb).Body mass index is 32.96 kg/m.  General Appearance: Casual  Eye Contact:  Good  Speech:  Clear and Coherent  Volume:  Normal  Mood:  Dysphoric  Affect:  Appropriate  Thought Process:  Coherent  Orientation:  Full (Time, Place, and Person)  Thought Content:  Negative  Suicidal Thoughts:  No  Homicidal Thoughts:  No  Memory:  Immediate;   Good  Judgement:  Intact  Insight:  Lacking   Psychomotor Activity:  Normal  Concentration:  Concentration: Good  Recall:  Good  Fund of Knowledge:  Good  Language:  Good  Akathisia:  No  Handed:  Right  AIMS (if indicated):     Assets:  Communication Skills Desire for Improvement Physical Health Talents/Skills Vocational/Educational  ADL's:  Intact  Cognition:  WNL  Sleep:  Number of Hours: 6.5     Treatment Plan Summary: Daily contact with patient to assess and evaluate symptoms and progress in treatment, Medication management  and Plan 1) patient is doing well, minimal depressive symptoms or withdrawal symptoms. No change in current treatment plan. Probable discharge in 1-2 days  Antonieta Pert, MD 06/17/2016, 12:14 PM

## 2016-06-17 NOTE — Progress Notes (Signed)
Recreation Therapy Notes  Date: 06/17/16 Time: 0930 Location: 300 Hall Group Room  Group Topic: Stress Management  Goal Area(s) Addresses:  Patient will verbalize importance of using healthy stress management.  Patient will identify positive emotions associated with healthy stress management.   Behavioral Response: Engaged  Intervention: Stress Management  Activity :  Meditation.  LRT introduced the stress management technique of meditation.  LRT played a meditation from the Calm app to allows patients to engage and learn the benefits of meditation.  Patiens were to follow along as the meditation played to engage in the technique.  Education:  Stress Management, Discharge Planning.   Education Outcome: Acknowledges edcuation/In group clarification offered/Needs additional education  Clinical Observations/Feedback: Pt attended group.    Caroll RancherMarjette Evian Derringer, LRT/CTRS        Caroll RancherLindsay, Leilyn Frayre A 06/17/2016 12:32 PM

## 2016-06-17 NOTE — Progress Notes (Signed)
Patient ID: Hiram ComberJason Island, male   DOB: 1975/10/27, 41 y.o.   MRN: [id number]  Pt currently presents with an anxious affect and behavior. Pt states "my wife is trying to screw me over, she just told me a couple of minutes ago so I am feeling anxious." Pt reports good sleep with current medication regimen.   Pt provided with scheduled and as needed medications per providers orders. Pt's labs and vitals were monitored throughout the night. Pt given a 1:1 about emotional and mental status. Pt supported and encouraged to express concerns and questions. Pt educated on medications.  Pt's safety ensured with 15 minute and environmental checks. Pt currently denies SI/HI and A/V hallucinations. Pt verbally agrees to seek staff if SI/HI or A/VH occurs and to consult with staff before acting on any harmful thoughts. Will continue POC.

## 2016-06-17 NOTE — Progress Notes (Signed)
Shayla from [facility] wanted to do phone screen with pt this afternoon. Pt given information and encouraged to call. Pt did not call and does not appear to be vested in treatment. CSW contacted Shayla who confirmed that pt did not call.  Trula SladeHeather Smart, MSW, LCSW Clinical Social Worker 06/17/2016 3:55 PM

## 2016-06-17 NOTE — [facility] Group Notes (Signed)
[facility] LCSW Group Therapy  06/17/2016 3:52 PM  Type of Therapy:  Group Therapy  Participation Level:  Did Not Attend -pt invited. Chose to remain in room.   Summary of Progress/Problems: Today's Topic: Overcoming Obstacles. Patients identified one short term goal and potential obstacles in reaching this goal. Patients processed barriers involved in overcoming these obstacles. Patients identified steps necessary for overcoming these obstacles and explored motivation (internal and external) for facing these difficulties head on.   Scott Chandler N Smart LCSW 06/17/2016, 3:52 PM

## 2016-06-18 MED ORDER — NICOTINE POLACRILEX 2 MG MT GUM: 2.0000 mg | CHEWING_GUM | OROMUCOSAL | 0 refills | Status: AC | PRN

## 2016-06-18 MED ORDER — IBUPROFEN 200 MG PO TABS: 400.0000 mg | ORAL_TABLET | Freq: Four times a day (QID) | ORAL | 0 refills | Status: AC | PRN

## 2016-06-18 MED ORDER — HYDROXYZINE HCL 25 MG PO TABS: ORAL_TABLET | ORAL | 0 refills | Status: AC

## 2016-06-18 MED ORDER — DULOXETINE HCL 60 MG PO CPEP: 60.0000 mg | ORAL_CAPSULE | Freq: Every day | ORAL | 0 refills | Status: AC

## 2016-06-18 MED ORDER — GABAPENTIN 300 MG PO CAPS: 300.0000 mg | ORAL_CAPSULE | Freq: Two times a day (BID) | ORAL | 0 refills | Status: AC

## 2016-06-18 MED ORDER — TRAZODONE HCL 50 MG PO TABS: 50.0000 mg | ORAL_TABLET | Freq: Every evening | ORAL | 0 refills | Status: AC | PRN

## 2016-06-18 NOTE — Progress Notes (Signed)
Patient discharged per physician order; patient denies SI/HI and A/V hallucinations; patient received prescriptions, samples, AVS, suicide risk assessment note, and transition record given to the patient after it was reviewed; patient had no other questions or concerns at this time; patient verbalized and signed that all belongings were returned; patient left the unit ambulatory 

## 2016-06-18 NOTE — [facility] Suicide Risk Assessment (Signed)
[facility]   Principal Problem: depression, suicidality, alcohol use disorder Discharge Diagnoses:  Patient Active Problem List   Diagnosis Date Noted  . Alcohol use disorder (HCC) [F10.99] 06/15/2016  . Alcohol abuse with alcohol-induced mood disorder (HCC) [F10.14] 11/14/2015  . EPIGASTRIC PAIN [R10.13] 10/22/2009  . FECAL OCCULT BLOOD [R19.5] 10/22/2009  . HYPERLIPIDEMIA [E78.5] 10/21/2009  . ESOPHAGITIS, HX OF [Z87.19] 10/21/2009  . GERD [K21.9] 05/31/2008  . CHEST PAIN [R07.9] 05/31/2008  . DIARRHEA [R19.7] 05/31/2008    Total Time spent with patient: 20 minutes  Musculoskeletal: Strength & Muscle Tone: within normal limits Gait & Station: normal Patient leans: N/A  Psychiatric Specialty Exam: ROS  Blood pressure 124/86, pulse 87, temperature 97.9 F (36.6 C), temperature source Oral, resp. rate 16, height 5' 10.5" (1.791 m), weight 105.7 kg (233 lb).Body mass index is 32.96 kg/m.  General Appearance: Casual  Eye Contact::  Good  Speech:  Clear and Coherent409  Volume:  Normal  Mood:  Euthymic  Affect:  Appropriate  Thought Process:  Coherent  Orientation:  Full (Time, Place, and Person)  Thought Content:  Logical  Suicidal Thoughts:  No  Homicidal Thoughts:  No  Memory:  Immediate;   Good  Judgement:  Intact  Insight:  Fair  Psychomotor Activity:  Normal  Concentration:  Good  Recall:  Fair  Fund of Knowledge:Good  Language: Good  Akathisia:  No  Handed:  Right  AIMS (if indicated):     Assets:  Communication Skills Desire for Improvement Housing Physical Health Transportation Vocational/Educational  Sleep:  Number of Hours: 6.25  Cognition: WNL  ADL's:  Intact   Mental Status Per Nursing Assessment::   On Admission:  NA  Demographic Factors:  Male, Caucasian and Unemployed  Loss Factors: Legal issues and Financial problems/change in socioeconomic status  Historical Factors: Impulsivity  Risk Reduction Factors:    Living with another person, especially a relative, Positive social support, Positive therapeutic relationship and Positive coping skills or problem solving skills  Continued Clinical Symptoms:  Depression:   Comorbid alcohol abuse/dependence Alcohol/Substance Abuse/Dependencies  Cognitive Features That Contribute To Risk:  None    Suicide Risk:  Minimal: No identifiable suicidal ideation.  Patients presenting with no risk factors but with morbid ruminations; may be classified as minimal risk based on the severity of the depressive symptoms  Follow-up Information    [facility]   Specialty:  Behavioral Health Why:  Walk in between 8am-9am Monday through Friday for hospital follow-up/medication management/assessment for counseling services. [facility] 7 days of either hospital discharge or discharge from treatment facility. Thank you.  Contact information: [address] [place] [address] [phone]        [facility] Follow up on 06/22/2016.   Why:  Screening for possible admission on this date at 0745AM. Please bring photo ID/proof of [place] residence. Thank you.  Contact information: [place] Jenkins[address] [place] [address] [phone]           Plan Of Care/Follow-up recommendations:  Activity:  ad lib  Antonieta PertGreg Lawson Clary, MD 06/18/2016, 9:59 AM

## 2016-06-18 NOTE — Tx Team (Signed)
Interdisciplinary Treatment and Diagnostic Plan Update  06/18/2016 Time of Session: 0930 Scott Chandler MRN: [id number]  Principal Diagnosis: MDD recurrent, severe with psychotic features  Secondary Diagnoses: Active Problems:   Alcohol use disorder (HCC)   Current Medications:  Current Facility-Administered Medications  Medication Dose Route Frequency Provider Last Rate Last Dose  . DULoxetine (CYMBALTA) DR capsule 60 mg  60 mg Oral Daily Sharma Covert, MD   60 mg at 06/18/16 0751  . gabapentin (NEURONTIN) capsule 300 mg  300 mg Oral BID Sharma Covert, MD   300 mg at 06/18/16 0751  . hydrOXYzine (ATARAX/VISTARIL) tablet 25 mg  25 mg Oral Q6H PRN Sharma Covert, MD   25 mg at 06/17/16 1954  . ibuprofen (ADVIL,MOTRIN) tablet 400 mg  400 mg Oral Q6H PRN Sharma Covert, MD      . loperamide (IMODIUM) capsule 2-4 mg  2-4 mg Oral PRN Benjamine Mola, FNP      . LORazepam (ATIVAN) tablet 1 mg  1 mg Oral Q6H PRN Benjamine Mola, FNP      . magnesium hydroxide (MILK OF MAGNESIA) suspension 30 mL  30 mL Oral Daily PRN Benjamine Mola, FNP      . multivitamin with minerals tablet 1 tablet  1 tablet Oral Daily Benjamine Mola, FNP   1 tablet at 06/18/16 0751  . nicotine polacrilex (NICORETTE) gum 2 mg  2 mg Oral PRN Jenne Campus, MD      . ondansetron (ZOFRAN-ODT) disintegrating tablet 4 mg  4 mg Oral Q6H PRN Benjamine Mola, FNP      . thiamine (B-1) injection 100 mg  100 mg Intramuscular Once Benjamine Mola, FNP      . thiamine (VITAMIN B-1) tablet 100 mg  100 mg Oral Daily Benjamine Mola, FNP   100 mg at 06/18/16 0751  . traZODone (DESYREL) tablet 50 mg  50 mg Oral QHS PRN Benjamine Mola, FNP   50 mg at 06/16/16 2131   PTA Medications: Prescriptions Prior to Admission  Medication Sig Dispense Refill Last Dose  . DULoxetine (CYMBALTA) 20 MG capsule Take two 20 mg capsules by mouth once daily 60 capsule 3 Past Week at Unknown time  . gabapentin (NEURONTIN) 300 MG capsule Take 1 capsule  (300 mg total) by mouth at bedtime. 30 capsule 0 Past Month at Unknown time  . hydrOXYzine (ATARAX/VISTARIL) 25 MG tablet Take 1 tablet (25 mg total) by mouth every 6 (six) hours as needed (anxiety/agitation or CIWA < or = 10). 60 tablet 0 unk  . ibuprofen (ADVIL,MOTRIN) 200 MG tablet Take 400 mg by mouth every 6 (six) hours as needed for moderate pain.   unk    Patient Stressors: Financial difficulties Marital or family conflict Substance abuse Other: daughter has legal issues  Patient Strengths: Curator fund of knowledge Physical Health  Treatment Modalities: Medication Management, Group therapy, Case management,  1 to 1 session with clinician, Psychoeducation, Recreational therapy.   Physician Treatment Plan for Primary Diagnosis: MDD recurrent, severe with psychotic features  Long Term Goal(s): Improvement in symptoms so as ready for discharge Improvement in symptoms so as ready for discharge   Short Term Goals: Ability to identify changes in lifestyle to reduce recurrence of condition will improve Ability to verbalize feelings will improve Ability to disclose and discuss suicidal ideas Ability to demonstrate self-control will improve Ability to identify and develop effective coping behaviors will improve Ability to maintain clinical  measurements within normal limits will improve Compliance with prescribed medications will improve Ability to identify triggers associated with substance abuse/mental health issues will improve Ability to identify changes in lifestyle to reduce recurrence of condition will improve Ability to verbalize feelings will improve Ability to disclose and discuss suicidal ideas Ability to demonstrate self-control will improve Ability to identify and develop effective coping behaviors will improve Ability to maintain clinical measurements within normal limits will improve Compliance with prescribed medications will improve Ability to  identify triggers associated with substance abuse/mental health issues will improve  Medication Management: Evaluate patient's response, side effects, and tolerance of medication regimen.  Therapeutic Interventions: 1 to 1 sessions, Unit Group sessions and Medication administration.  Evaluation of Outcomes: Met  Physician Treatment Plan for Secondary Diagnosis: Active Problems:   Alcohol use disorder ([place])  Long Term Goal(s): Improvement in symptoms so as ready for discharge Improvement in symptoms so as ready for discharge   Short Term Goals: Ability to identify changes in lifestyle to reduce recurrence of condition will improve Ability to verbalize feelings will improve Ability to disclose and discuss suicidal ideas Ability to demonstrate self-control will improve Ability to identify and develop effective coping behaviors will improve Ability to maintain clinical measurements within normal limits will improve Compliance with prescribed medications will improve Ability to identify triggers associated with substance abuse/mental health issues will improve Ability to identify changes in lifestyle to reduce recurrence of condition will improve Ability to verbalize feelings will improve Ability to disclose and discuss suicidal ideas Ability to demonstrate self-control will improve Ability to identify and develop effective coping behaviors will improve Ability to maintain clinical measurements within normal limits will improve Compliance with prescribed medications will improve Ability to identify triggers associated with substance abuse/mental health issues will improve     Medication Management: Evaluate patient's response, side effects, and tolerance of medication regimen.  Therapeutic Interventions: 1 to 1 sessions, Unit Group sessions and Medication administration.  Evaluation of Outcomes: Progressing   RN Treatment Plan for Primary Diagnosis: MDD recurrent, severe with  psychotic features  Long Term Goal(s): Knowledge of disease and therapeutic regimen to maintain health will improve  Short Term Goals: Ability to remain free from injury will improve, Ability to disclose and discuss suicidal ideas and Ability to identify and develop effective coping behaviors will improve  Medication Management: RN will administer medications as ordered by provider, will assess and evaluate patient's response and provide education to patient for prescribed medication. RN will report any adverse and/or side effects to prescribing provider.  Therapeutic Interventions: 1 on 1 counseling sessions, Psychoeducation, Medication administration, Evaluate responses to treatment, Monitor vital signs and CBGs as ordered, Perform/monitor CIWA, COWS, AIMS and Fall Risk screenings as ordered, Perform wound care treatments as ordered.  Evaluation of Outcomes: Met   LCSW Treatment Plan for Primary Diagnosis: MDD recurrent, severe with psychotic features   Long Term Goal(s): Safe transition to appropriate next level of care at discharge, Engage patient in therapeutic group addressing interpersonal concerns.  Short Term Goals: Engage patient in aftercare planning with referrals and resources, Facilitate patient progression through stages of change regarding substance use diagnoses and concerns and Identify triggers associated with mental health/substance abuse issues  Therapeutic Interventions: Assess for all discharge needs, 1 to 1 time with Social worker, Explore available resources and support systems, Assess for adequacy in community support network, Educate family and significant other(s) on suicide prevention, Complete Psychosocial Assessment, Interpersonal group therapy.  Evaluation of Outcomes: Adequate for discharge.  Progress in Treatment: Attending groups: No. Pt stays in room  Participating in groups: No. Taking medication as prescribed: Yes. Toleration medication:  Yes. Family/Significant other contact made: SPE completed with pt; pt declined to consent to family contact.  Patient understands diagnosis: Yes. Discussing patient identified problems/goals with staff: Yes. Medical problems stabilized or resolved: Yes. Denies suicidal/homicidal ideation: Yes. Issues/concerns per patient self-inventory: No. Other: n/a   New problem(s) identified: No, Describe:  n/a  New Short Term/Long Term Goal(s): detox; medication stabilization; development of comprehensive mental wellness/sobriety plan.   Discharge Plan or Barriers: Pt ultimately declined [facility] and [facility] beds/ changed plan on 2/22--now planning to move in with girlfriend in [place]. Signed ROI to seek outpatient mental health care at [facility] in [place], [place]. Pt's girlfriend will pick him up today at noon.   Reason for Continuation of Hospitalization: none  Estimated Length of Stay: discharge today   Attendees: Patient: 06/18/2016 10:34 AM  Physician: Dr. Mallie Darting MD 06/18/2016 10:34 AM  Nursing: Gari Crown RN; Roni RN 06/18/2016 10:34 AM  RN Care Manager: Lars Pinks CM 06/18/2016 10:34 AM  Social Worker: Press photographer, LCSW; Matthew Saras LCSWA  06/18/2016 10:34 AM  Recreational Therapist: Rhunette Croft 06/18/2016 10:34 AM  Other: Lindell Spar NP; May Augustin NP 06/18/2016 10:34 AM  Other:  06/18/2016 10:34 AM  Other: 06/18/2016 10:34 AM    Scribe for Treatment Team: [place], LCSW 06/18/2016 10:34 AM

## 2016-06-18 NOTE — [facility] Group Notes (Signed)
The focus of this group is to educate the patient on the purpose and policies of crisis stabilization and provide a format to answer questions about their admission.  The group details unit policies and expectations of patients while admitted. Patient attended group. Patient was engaging and cooperative. 

## 2016-06-18 NOTE — [facility] Suicide Risk Assessment (Signed)
[facility] INPATIENT:  Family/Significant Other Suicide Prevention Education  Suicide Prevention Education:  Patient Refusal for Family/Significant Other Suicide Prevention Education: The patient Scott Chandler has refused to provide written consent for family/significant other to be provided Family/Significant Other Suicide Prevention Education during admission and/or prior to discharge.  Physician notified.  SPE completed with pt, as pt refused to consent to family contact. SPI pamphlet provided to pt and pt was encouraged to share information with support network, ask questions, and talk about any concerns relating to SPE. Pt denies access to guns/firearms and verbalized understanding of information provided. Mobile Crisis information also provided to pt.   Shaunika Italiano N Smart LCSW 06/18/2016, 10:32 AM

## 2016-06-18 NOTE — Discharge Summary (Signed)
Physician Discharge Summary Note  Patient:  Scott Chandler is an 41 y.o., male MRN:  [id number] DOB:  1975/05/17 Patient phone:  [phone] (home)  Patient address:   [place] [address],  Total Time spent with patient: Greater than 30 minutes  Date of Admission:  06/15/2016  Date of Discharge: 12-30-15  Reason for Admission:   Principal Problem: Alcohol use disorder, chronic, dependence.  Discharge Diagnoses: Patient Active Problem List   Diagnosis Date Noted  . Alcohol use disorder (HCC) [F10.99] 06/15/2016  . Alcohol abuse with alcohol-induced mood disorder (HCC) [F10.14] 11/14/2015  . EPIGASTRIC PAIN [R10.13] 10/22/2009  . FECAL OCCULT BLOOD [R19.5] 10/22/2009  . HYPERLIPIDEMIA [E78.5] 10/21/2009  . ESOPHAGITIS, HX OF [Z87.19] 10/21/2009  . GERD [K21.9] 05/31/2008  . CHEST PAIN [R07.9] 05/31/2008  . DIARRHEA [R19.7] 05/31/2008   Past Psychiatric History: Alcoholism, chronic  Past Medical History:  Past Medical History:  Diagnosis Date  . Chest pain   . Depression   . ETOH abuse   . GERD (gastroesophageal reflux disease)   . History of hiatal hernia   . Suicidal ideation     Past Surgical History:  Procedure Laterality Date  . TRANSTHORACIC ECHOCARDIOGRAM  05/18/2008   normal LVF/ mild LVH/trace mitral and tricuspid regurgitation   Family History:  Family History  Problem Relation Age of Onset  . Hypertension Father   . Cancer Mother    Family Psychiatric  History: See H&P  Social History:  History  Alcohol Use  . Yes    Comment: 5th a day     History  Drug Use No    Social History   Social History  . Marital status: Legally Separated    Spouse name: N/A  . Number of children: N/A  . Years of education: N/A   Occupational History  . Unemployed    Social History Main Topics  . Smoking status: Former Games developer  . Smokeless tobacco: Never Used     Comment: Pt stated "I vape"  . Alcohol use Yes     Comment: 5th a day  . Drug use: No   . Sexual activity: Not Currently    Partners: Male   Other Topics Concern  . None   Social History Narrative   Pt lost his job and is separated from his wife.   Hospital Course:  Patient is a 41 YO male with a PPHx significant for depression and alcohol use disorder. The patient "broke in" to his ex-wife's "alcohol stash" and drank all of it. He was brought to the ED by his ex-wife and he drank a six pack en route. He complained of suicidal ideation and decision was made to admit. The patient was recently held in the observation unit for similar circumstances approximately 2-3 days ago. He started drinking again after discharge. The patient stated that he had recently lost his job in [place] where he had been living with a GF. He has had an unstable relationship with the GF and returned to the [place] area. He stated that the longest period of his sobriety was approximately 3 months while he had been in [place]. He had previously been able to maintain sobriety while taking zoloft and ativan. He stated that when his depression is under better control he is able to refrain from drinking. He admitted to helplessness, hopelessness, and sadness. He admitted to [facility] without plan. "I never had the guts to do it". Stressors include unemployment, homelessness, a daughter in a group home.  Leodis was admitted to the Hospital with a blood alcohol level of  55. He has been in this hospital x several times, all related to alcohol intoxication & substance induced mood disorder. He did admit having problem with alcoholism as he had in the past considered himself an alcoholic. Carrie cited familial stressors (unstable relationship issues & worsening depression) as the reasons for his drinking too much. He was having suicidal ideations.   After admission assessment, Dillan received no alcohol detoxification treatments because he was not presenting with alcohol withdrawal symptoms on admission. And throughout his hospitalization,  he never did develop any alcohol withdrawal symptoms.  He was however, medicated & discharged on; Duloxetine 60 mg for depression, gabapentin 300 mg for agitation/substance withdrawal symptoms, Hydroxyzine 25 mg prn for anxiety, Nicorette gum for smoking cessation & Trazodone 50 mg for insomnia. He was also enrolled & participated in the group counseling sessions being offered & held on this unit. He learned coping skills.  Jerardo's symptoms responded well to his treatment regimen. His mood is stable. His anxiety symptoms stabilized. There are no evidence of substance withdrawal symptoms. He appears to be medically & mentally stable to be discharged to continue substance abuse/psychiatric treatments as noted below. He is provided with all the necessary information needed to make this appointment without problems.   Upon discharge, Theador adamantly denies any SIHI, AVH, delusional thoughts, paranoia or substance withdrawal symptoms. He received a 7 days worth, supply samples of his [facility] discharge medications. He left [facility] with all personal belongings in no apparent distress. Transportation per his arrangement (girl-friend).  Physical Findings: AIMS: Facial and Oral Movements Muscles of Facial Expression: None, normal Lips and Perioral Area: None, normal Jaw: None, normal Tongue: None, normal,Extremity Movements Upper (arms, wrists, hands, fingers): None, normal Lower (legs, knees, ankles, toes): None, normal, Trunk Movements Neck, shoulders, hips: None, normal, Overall Severity Severity of abnormal movements (highest score from questions above): None, normal Incapacitation due to abnormal movements: None, normal Patient's awareness of abnormal movements (rate only patient's report): No Awareness, Dental Status Current problems with teeth and/or dentures?: No Does patient usually wear dentures?: No  CIWA:  CIWA-Ar Total: 1 COWS:     Musculoskeletal: Strength & Muscle Tone: within normal  limits Gait & Station: normal Patient leans: N/A  Psychiatric Specialty Exam: Physical Exam  Constitutional: He appears well-developed.  HENT:  Head: Normocephalic.  Eyes: Pupils are equal, round, and reactive to light.  Neck: Normal range of motion.  Cardiovascular: Normal rate.   Respiratory: Effort normal.  GI: Soft.  Genitourinary:  Genitourinary Comments: Deferred  Musculoskeletal: Normal range of motion.  Neurological: He is alert.  Skin: Skin is warm.    Review of Systems  Constitutional: Negative.   HENT: Negative.   Eyes: Negative.   Respiratory: Negative.   Cardiovascular: Negative.   Gastrointestinal: Negative.   Genitourinary: Negative.   Musculoskeletal: Negative.   Skin: Negative.   Neurological: Negative.   Endo/Heme/Allergies: Negative.   Psychiatric/Behavioral: Positive for depression (Stable) and substance abuse (Hx. Alcoholism,chronic). Negative for hallucinations, memory loss and suicidal ideas. The patient has insomnia (Stable). The patient is not nervous/anxious.     Blood pressure 124/86, pulse 87, temperature 97.9 F (36.6 C), temperature source Oral, resp. rate 16, height 5' 10.5" (1.791 m), weight 105.7 kg (233 lb).Body mass index is 32.96 kg/m.  See Md's SRA   Have you used any form of tobacco in the last 30 days? (Cigarettes, Smokeless Tobacco, Cigars, and/or Pipes): Yes  Has  this patient used any form of tobacco in the last 30 days? (Cigarettes, Smokeless Tobacco, Cigars, and/or Pipes): Yes, provided with Nicorette gum prescription.  Blood Alcohol level:  Lab Results  Component Value Date   ETH 55 (H) 06/15/2016   ETH 171 (H) 06/10/2016   Metabolic Disorder Labs:  No results found for: HGBA1C, MPG No results found for: PROLACTIN No results found for: CHOL, TRIG, HDL, CHOLHDL, VLDL, LDLCALC  See Psychiatric Specialty Exam and Suicide Risk Assessment completed by Attending Physician prior to discharge.  Discharge destination:   Home  Is patient on multiple antipsychotic therapies at discharge:  No   Has Patient had three or more failed trials of antipsychotic monotherapy by history:  No  Recommended Plan for Multiple Antipsychotic Therapies: NA  Allergies as of 06/18/2016   No Known Allergies     Medication List    TAKE these medications     Indication  DULoxetine 60 MG capsule Commonly known as:  CYMBALTA Take 1 capsule (60 mg total) by mouth daily. For depression Start taking on:  06/19/2016 What changed:  medication strength  how much to take  how to take this  when to take this  additional instructions  Indication:  Major Depressive Disorder   gabapentin 300 MG capsule Commonly known as:  NEURONTIN Take 1 capsule (300 mg total) by mouth 2 (two) times daily. For agitation What changed:  when to take this  additional instructions  Indication:  Agitation   hydrOXYzine 25 MG tablet Commonly known as:  ATARAX/VISTARIL Take 1 tablet (25 mg) four times daily as needed: For anxiety What changed:  how much to take  how to take this  when to take this  reasons to take this  additional instructions  Indication:  Anxiety Neurosis   ibuprofen 200 MG tablet Commonly known as:  ADVIL,MOTRIN Take 2 tablets (400 mg total) by mouth every 6 (six) hours as needed for moderate pain.  Indication:  Mild to Moderate Pain   nicotine polacrilex 2 MG gum Commonly known as:  NICORETTE Take 1 each (2 mg total) by mouth as needed for smoking cessation.  Indication:  Nicotine Addiction   traZODone 50 MG tablet Commonly known as:  DESYREL Take 1 tablet (50 mg total) by mouth at bedtime as needed for sleep.  Indication:  Trouble Sleeping      Follow-up Information    [facility].   Why:  Walk in hours: Monday through Friday 8:30AM-11:00AM. Please walk in within 7 days of hospital discharge to be seen for hospital follow-up/mental health services. Thank  you.  Contact information: [address] [place], [place] [id number] Phone: [phone]/direct clinic line: [phone] Fax: [phone]         Follow-up recommendations: Activity:  As tolerated Diet: As recommended by your primary care doctor. Keep all scheduled follow-up appointments as recommended.  Comments: Patient is instructed prior to discharge to: Take all medications as prescribed by his/her mental healthcare provider. Report any adverse effects and or reactions from the medicines to his/her outpatient provider promptly. Patient has been instructed & cautioned: To not engage in alcohol and or illegal drug use while on prescription medicines. In the event of worsening symptoms, patient is instructed to call the crisis hotline, 911 and or go to the nearest ED for appropriate evaluation and treatment of symptoms. To follow-up with his/her primary care provider for your other medical issues, concerns and or health care needs.   Signed: Sanjuana KavaNwoko, Kylee Nardozzi I,  NP, PMHNP, FNP-BC 06/18/2016, 10:47 AM

## 2016-06-18 NOTE — Progress Notes (Signed)
  [facility] :  Will you be returning to the same living situation after discharge:  No. Pt plans to return to [place] at discharge.  At discharge, do you have transportation home?: Yes,  girlfriend coming at 12pm today to pick up pt. Do you have the ability to pay for your medications: Yes,  mental health  Release of information consent forms completed and submitted to medical records by CSW.  Patient to Follow up at: Follow-up Information    [facility].   Why:  Walk in hours: Monday through Friday 8:30AM-11:00AM. Please walk in within 7 days of hospital discharge to be seen for hospital follow-up/mental health services. Thank you.  Contact information: [address] [place], [place] [id number] Phone: [phone]/direct clinic line: [phone] Fax: [phone]          Next level of care provider [facility]:no  Safety Planning and Suicide Prevention discussed: Yes,  SPE completed with pt; pt declined to consent to family contact. SPI pamphlet and mobile crisis information provided.  Have you used any form of tobacco in the last 30 days? (Cigarettes, Smokeless Tobacco, Cigars, and/or Pipes): Yes  Has patient been referred to the Quitline?: Patient refused referral  Patient has been referred for addiction treatment: Yes  Laquetta Racey N Smart LCSW 06/18/2016, 10:33 AM
